# Patient Record
Sex: Female | Born: 1975 | Hispanic: No | Marital: Married | Smoking: Current every day smoker
Health system: Southern US, Community
[De-identification: ages and names within clinical notes are randomized; demographics above are authoritative.]

## PROBLEM LIST (undated history)

## (undated) DIAGNOSIS — M1990 Unspecified osteoarthritis, unspecified site: Secondary | ICD-10-CM

---

## 1999-03-17 ENCOUNTER — Inpatient Hospital Stay (HOSPITAL_COMMUNITY): Admission: AD | Admit: 1999-03-17 | Discharge: 1999-03-17 | Payer: Self-pay | Admitting: Obstetrics

## 1999-03-17 ENCOUNTER — Encounter: Payer: Self-pay | Admitting: Obstetrics

## 2000-10-10 ENCOUNTER — Other Ambulatory Visit: Admission: RE | Admit: 2000-10-10 | Discharge: 2000-10-10 | Payer: Self-pay

## 2003-01-22 ENCOUNTER — Encounter: Payer: Self-pay | Admitting: Obstetrics & Gynecology

## 2003-01-22 ENCOUNTER — Observation Stay (HOSPITAL_COMMUNITY): Admission: AD | Admit: 2003-01-22 | Discharge: 2003-01-24 | Payer: Self-pay | Admitting: Obstetrics and Gynecology

## 2003-01-25 ENCOUNTER — Inpatient Hospital Stay (HOSPITAL_COMMUNITY): Admission: AD | Admit: 2003-01-25 | Discharge: 2003-01-26 | Payer: Self-pay | Admitting: Obstetrics and Gynecology

## 2003-02-03 ENCOUNTER — Ambulatory Visit (HOSPITAL_COMMUNITY): Admission: AD | Admit: 2003-02-03 | Discharge: 2003-02-03 | Payer: Self-pay | Admitting: Obstetrics and Gynecology

## 2003-12-22 ENCOUNTER — Ambulatory Visit (HOSPITAL_COMMUNITY): Admission: RE | Admit: 2003-12-22 | Discharge: 2003-12-22 | Payer: Self-pay | Admitting: Rheumatology

## 2004-06-01 ENCOUNTER — Ambulatory Visit (HOSPITAL_COMMUNITY): Admission: RE | Admit: 2004-06-01 | Discharge: 2004-06-01 | Payer: Self-pay | Admitting: Rheumatology

## 2011-04-07 ENCOUNTER — Other Ambulatory Visit (HOSPITAL_COMMUNITY): Payer: Self-pay | Admitting: Family Medicine

## 2011-04-07 ENCOUNTER — Ambulatory Visit (HOSPITAL_COMMUNITY)
Admission: RE | Admit: 2011-04-07 | Discharge: 2011-04-07 | Disposition: A | Discharge status: Home / Self Care | Payer: 59 | Source: Ambulatory Visit | Attending: Family Medicine | Admitting: Family Medicine

## 2011-04-07 DIAGNOSIS — M25539 Pain in unspecified wrist: Secondary | ICD-10-CM | POA: Insufficient documentation

## 2011-04-07 DIAGNOSIS — M25579 Pain in unspecified ankle and joints of unspecified foot: Secondary | ICD-10-CM | POA: Insufficient documentation

## 2011-04-07 DIAGNOSIS — R52 Pain, unspecified: Secondary | ICD-10-CM

## 2011-06-23 ENCOUNTER — Ambulatory Visit (HOSPITAL_COMMUNITY)
Admission: RE | Admit: 2011-06-23 | Discharge: 2011-06-23 | Disposition: A | Discharge status: Home / Self Care | Payer: 59 | Source: Ambulatory Visit | Attending: Family Medicine | Admitting: Family Medicine

## 2011-06-23 ENCOUNTER — Other Ambulatory Visit (HOSPITAL_COMMUNITY): Payer: Self-pay | Admitting: Family Medicine

## 2011-06-23 DIAGNOSIS — M25476 Effusion, unspecified foot: Secondary | ICD-10-CM | POA: Insufficient documentation

## 2011-06-23 DIAGNOSIS — M25579 Pain in unspecified ankle and joints of unspecified foot: Secondary | ICD-10-CM | POA: Insufficient documentation

## 2011-06-23 DIAGNOSIS — M1990 Unspecified osteoarthritis, unspecified site: Secondary | ICD-10-CM

## 2011-06-23 DIAGNOSIS — M25473 Effusion, unspecified ankle: Secondary | ICD-10-CM | POA: Insufficient documentation

## 2012-01-11 IMAGING — CR DG FOOT COMPLETE 3+V*L*
3 series · 3 of 3 positions shown · non-contrast
Comparison: None

CLINICAL DATA: Pain and swelling.

LEFT FOOT - COMPLETE 3+ VIEW

[view not recorded (1 of 3)]
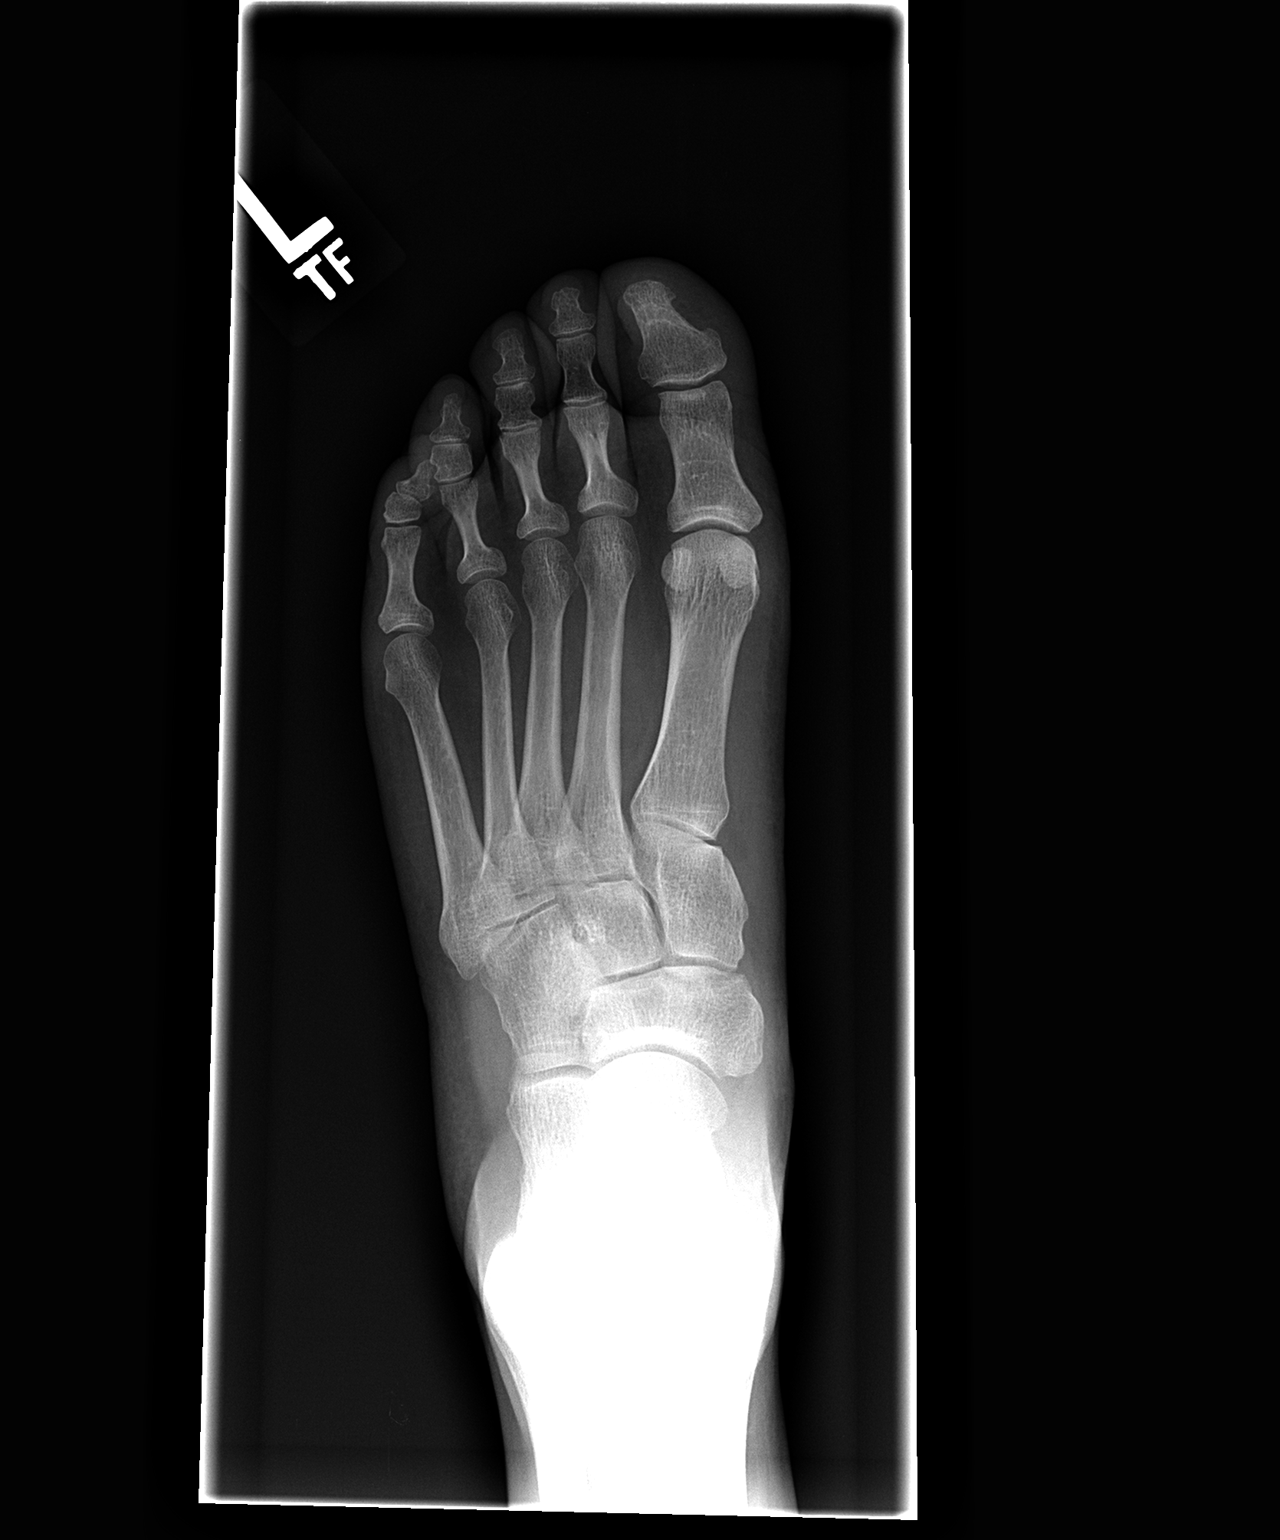

[view not recorded (2 of 3)]
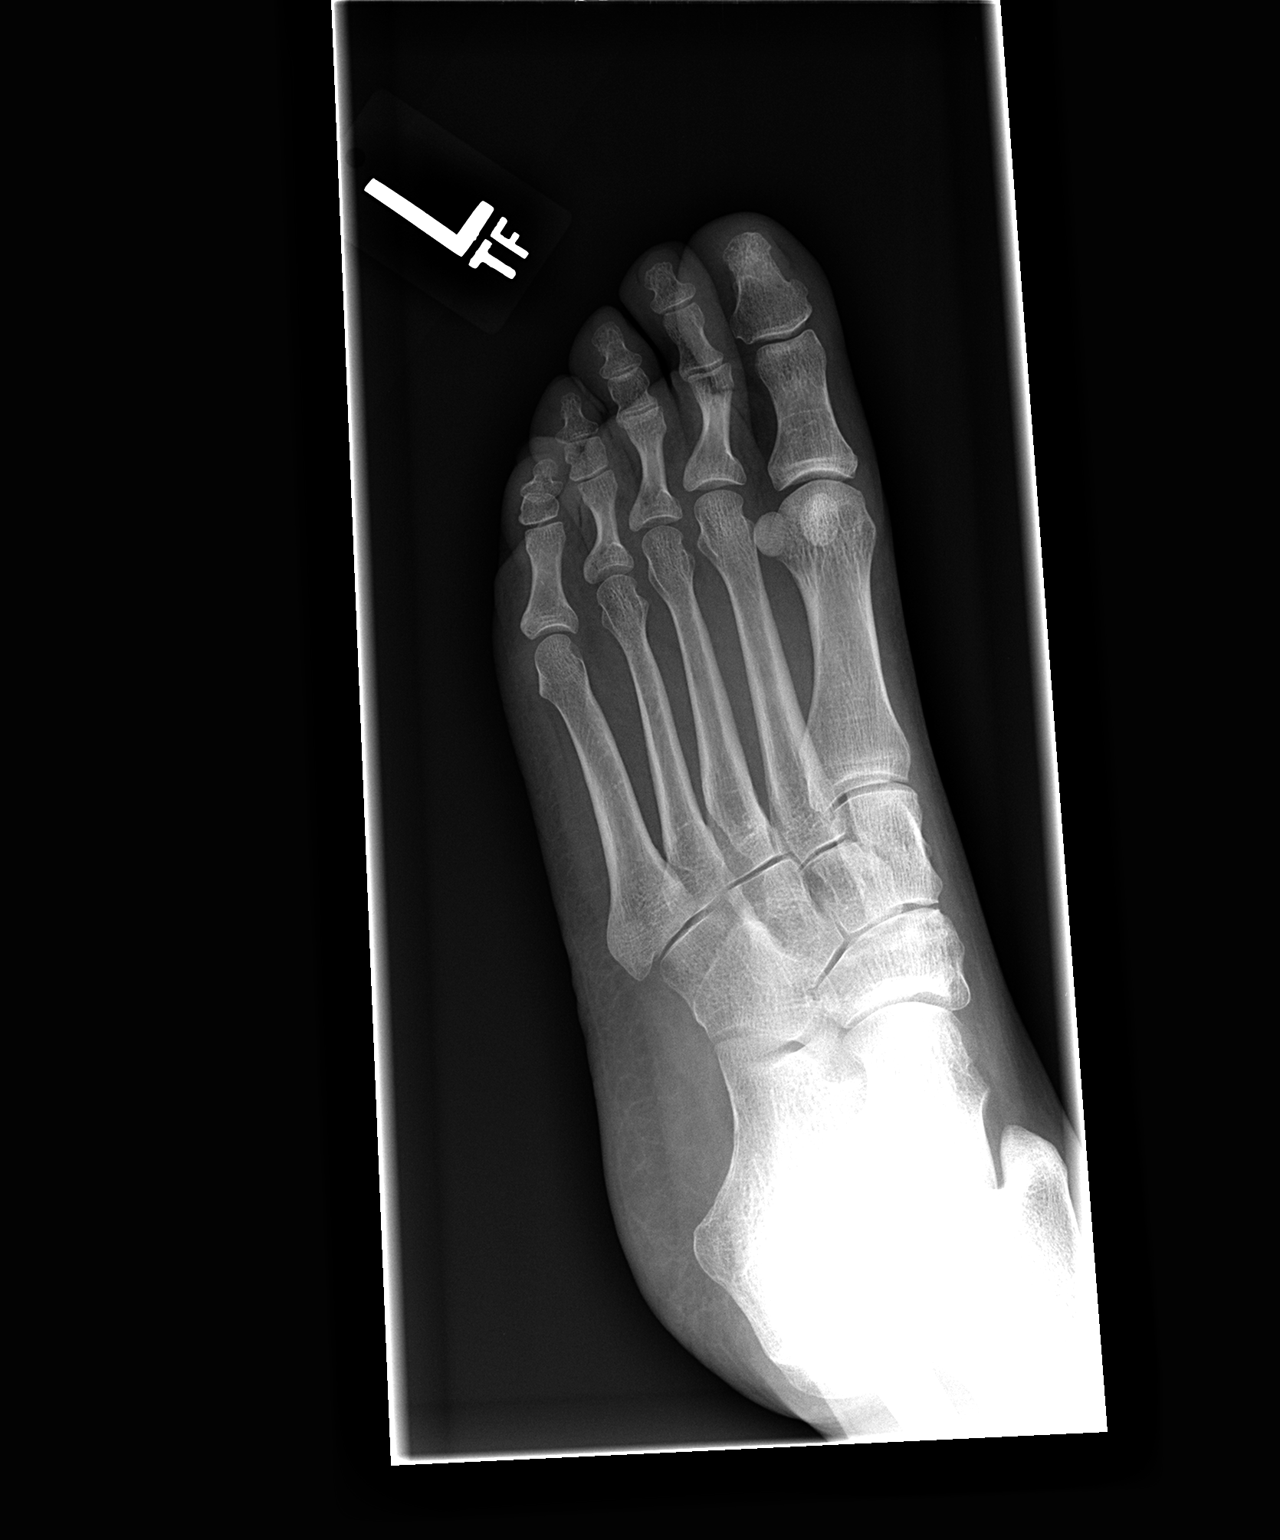

[view not recorded (3 of 3)]
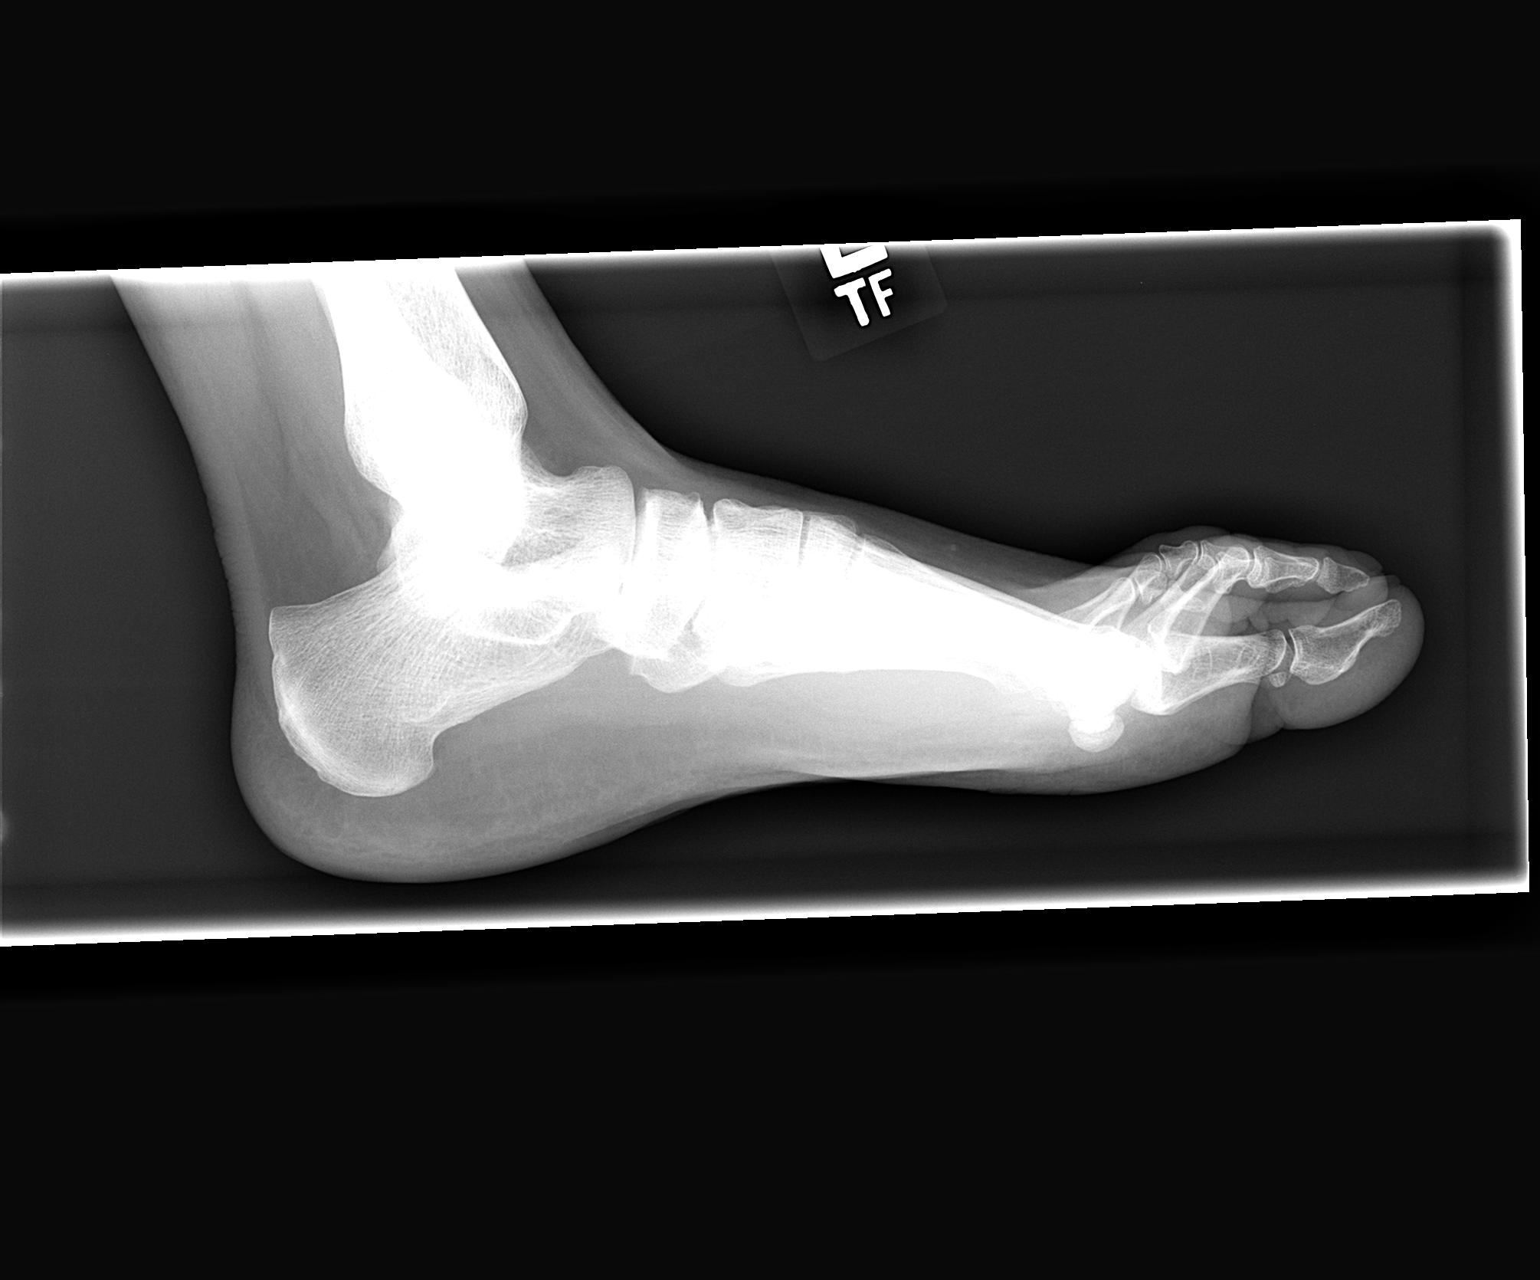

[3 of 3 positions shown; findings below may reference images not displayed]

FINDINGS: The joint spaces are maintained.  No erosions or cystic
changes.  Mineralization appears normal.
IMPRESSION: No degenerative changes or erosive findings.

## 2012-03-28 IMAGING — CR DG FOOT COMPLETE 3+V*R*
3 series · 3 of 3 positions shown · non-contrast
Comparison: 04/07/2011.

CLINICAL DATA: Pain and swelling in the right ankle and foot.

RIGHT FOOT COMPLETE - 3+ VIEW

[view not recorded (1 of 3)]
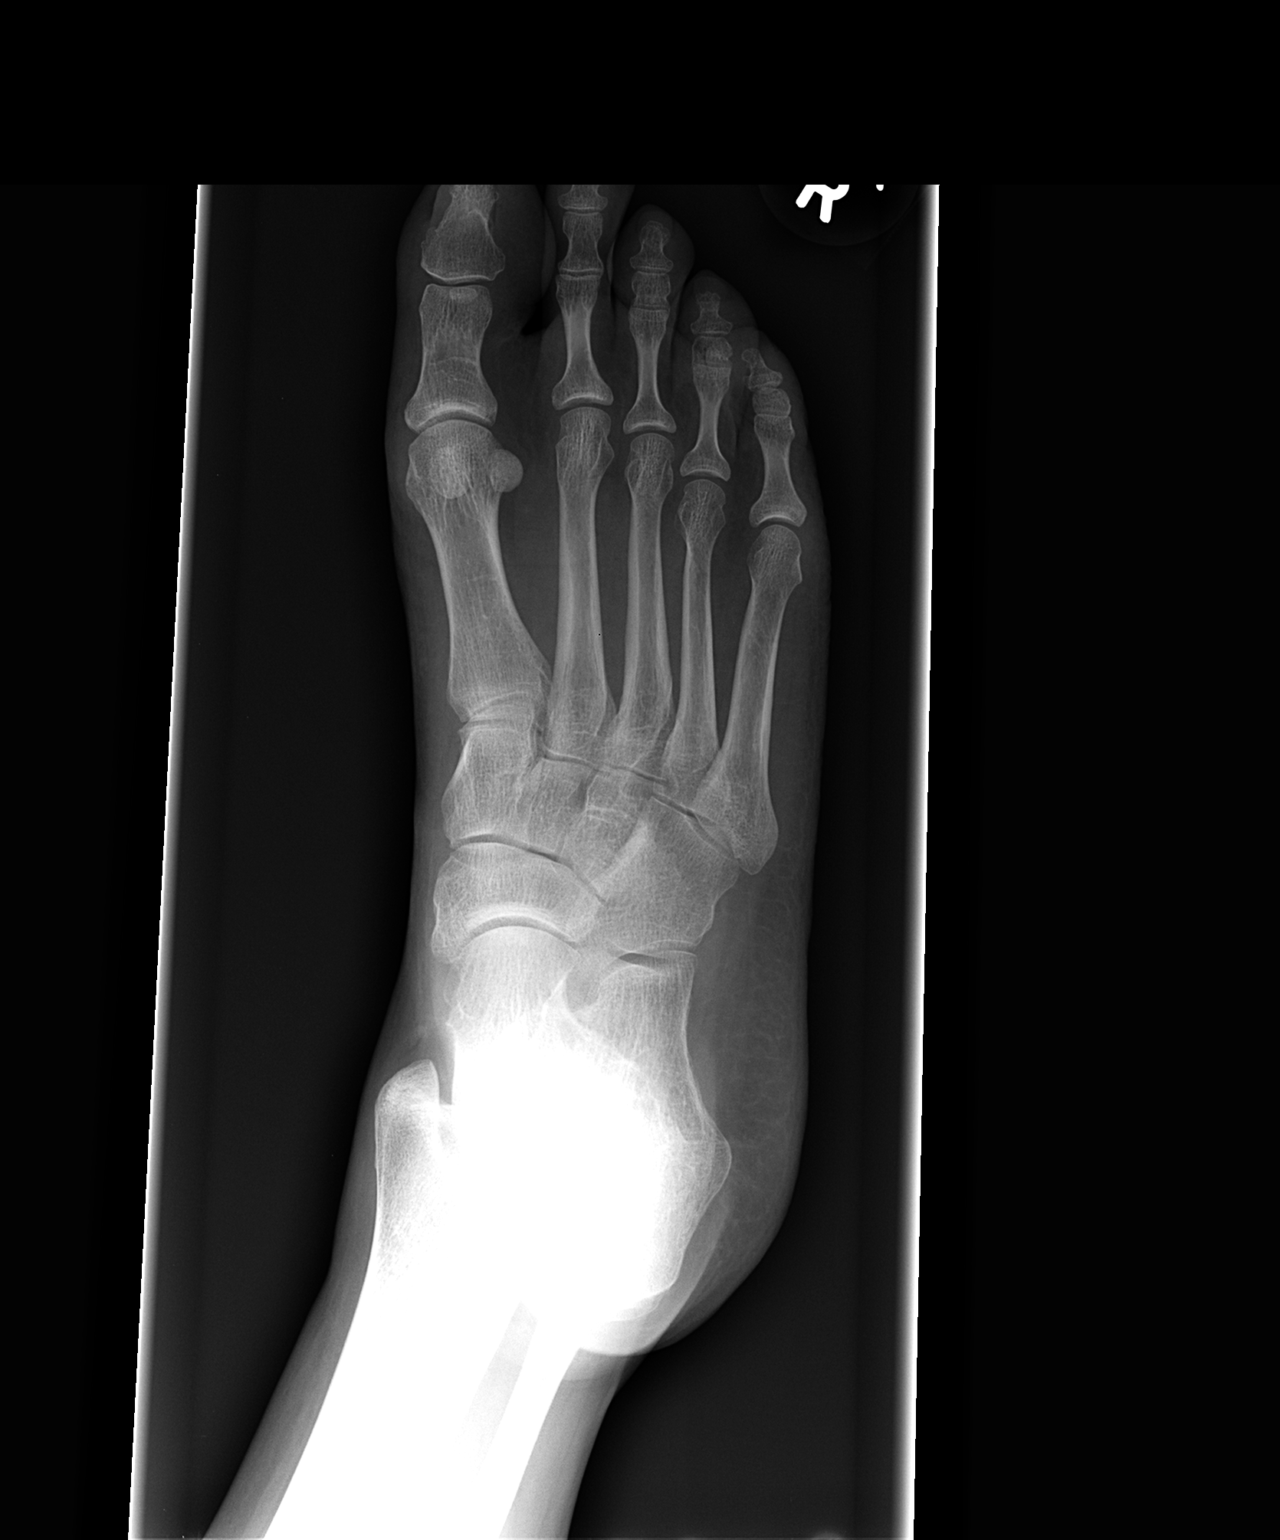

[view not recorded (2 of 3)]
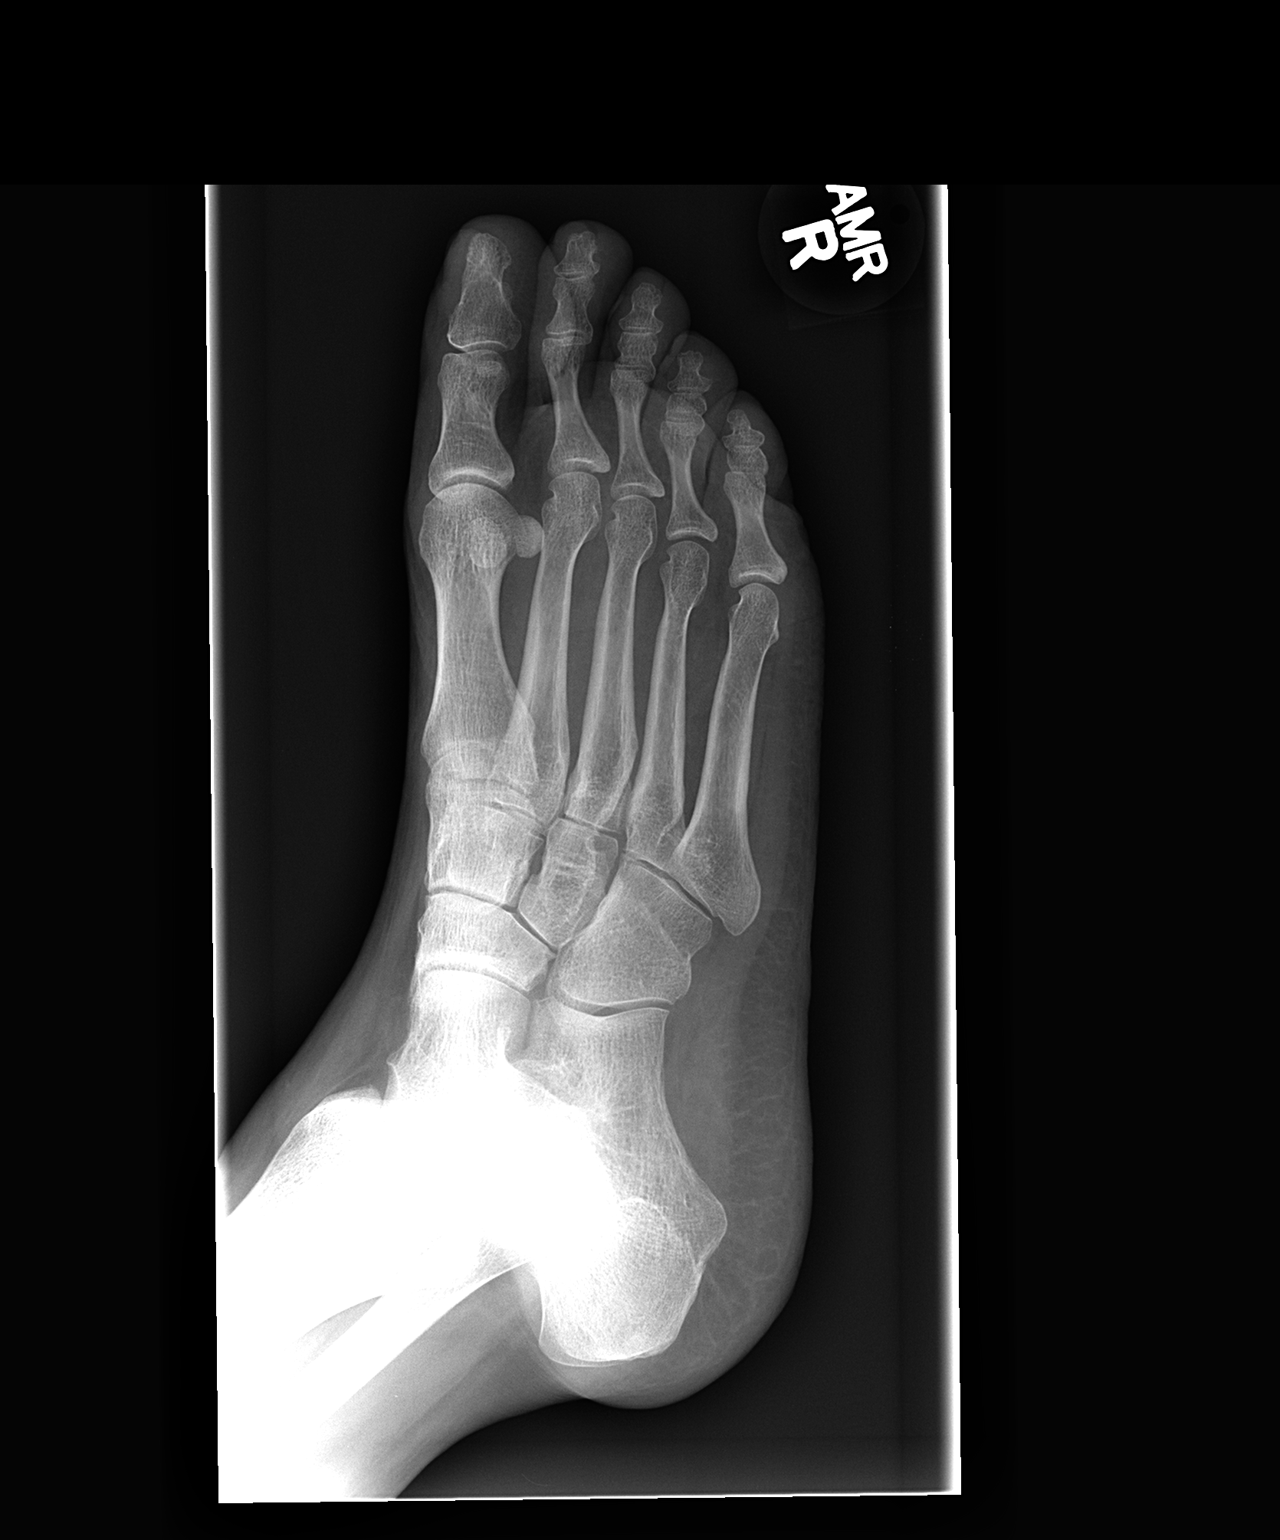

[view not recorded (3 of 3)]
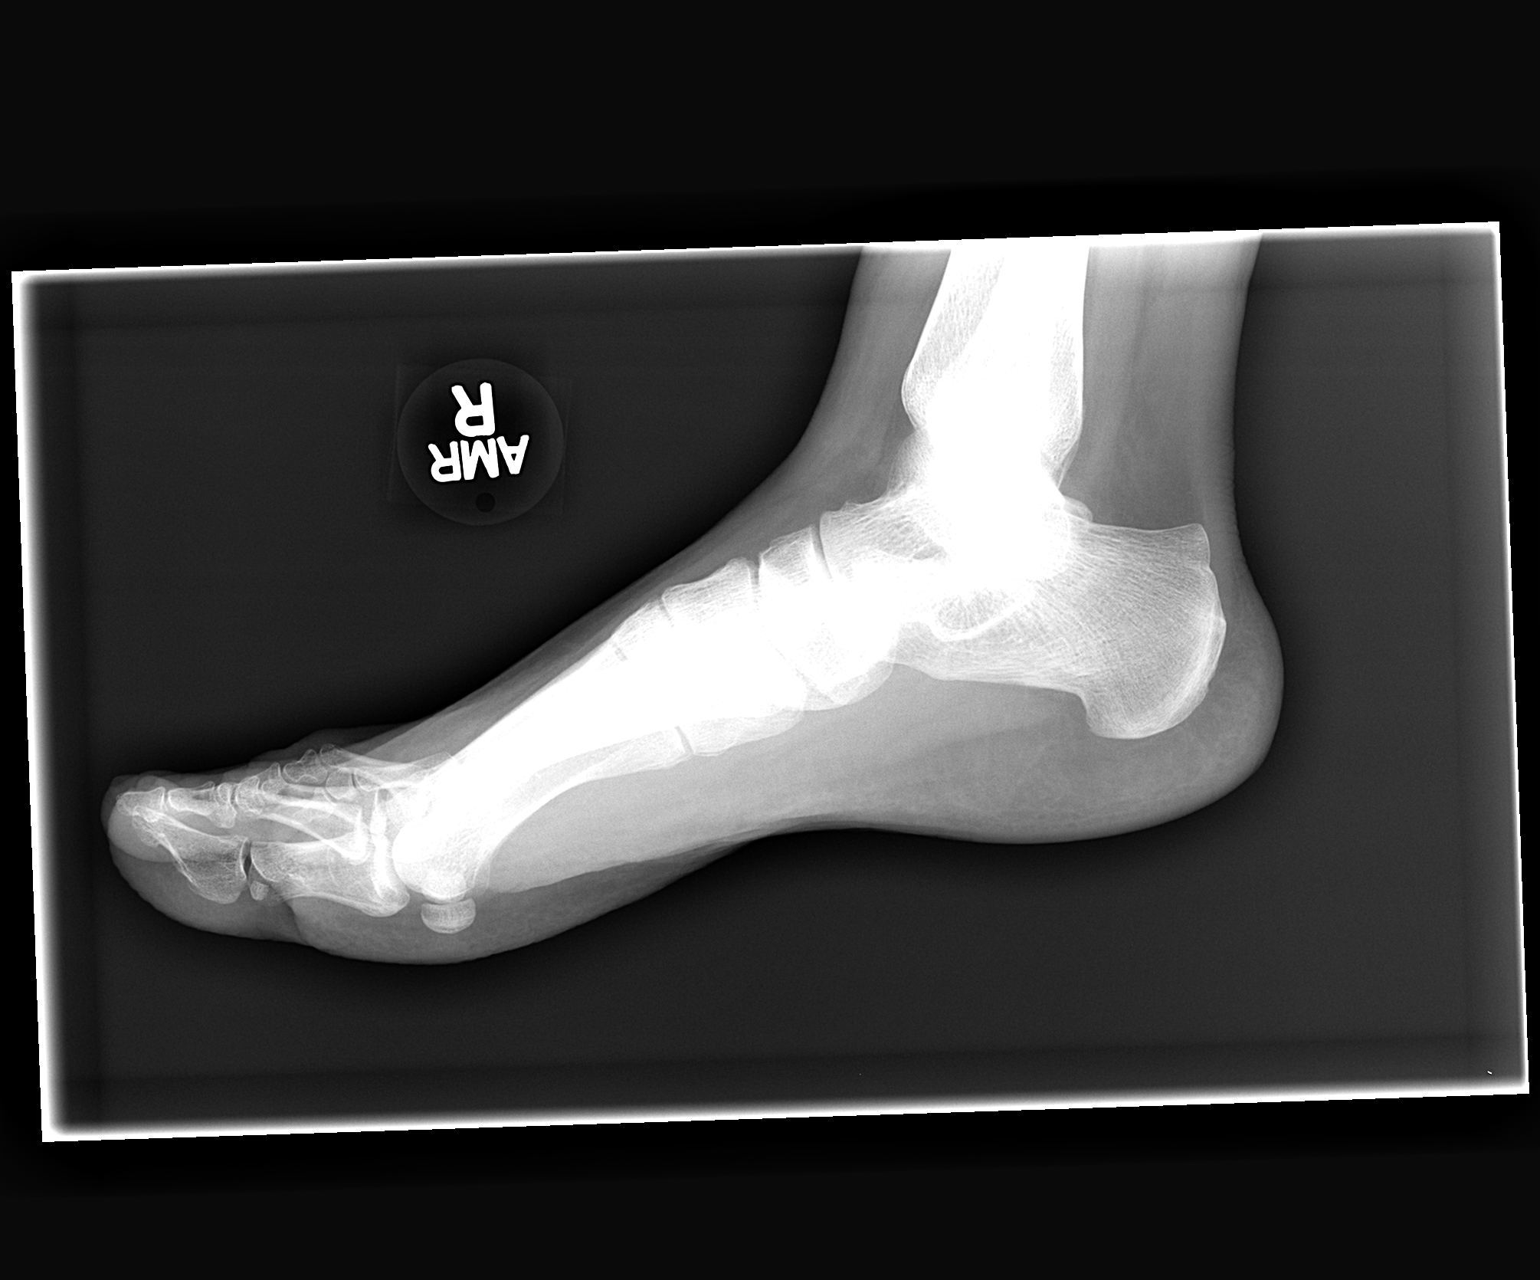

[3 of 3 positions shown; findings below may reference images not displayed]

FINDINGS: No acute osseous or joint abnormality.
IMPRESSION: No acute osseous or joint abnormality.

## 2018-10-09 ENCOUNTER — Ambulatory Visit: Payer: Self-pay

## 2018-10-09 NOTE — Telephone Encounter (Signed)
Incoming call from Patient who states that she  Was exposed to someone who tested positive for covid-19.  Patient reports a headache and aches allover her body .  Has not traveled out of the Country.  States she touched paper work that the person who tested positive.  Its was on May, 18th.    Reason for Disposition . [1] COVID-19 EXPOSURE (Close Contact) within last 14 days AND [2] NO cough, fever, or breathing difficulty  Answer Assessment - Initial Assessment Questions 1. CLOSE CONTACT: "Who is the person with the confirmed or suspected COVID-19 infection that you were exposed to?"     work 2. PLACE of CONTACT: "Where were you when you were exposed to COVID-19?" (e.g., home, school, medical waiting room; which city?)     work 3. TYPE of CONTACT: "How much contact was there?" (e.g., sitting next to, live in same house, work in same office, same building)     Same building 4. DURATION of CONTACT: "How long were you in contact with the COVID-19 patient?" (e.g., a few seconds, passed by person, a few minutes, live with the patient)    Touched paper work  I touched 5. DATE of CONTACT: "When did you have contact with a COVID-19 patient?" (e.g., how many days ago)     May /18/20 6. TRAVEL: "Have you traveled out of the country recently?" If so, "When and where?"  no     * Also ask about out-of-state travel, since the CDC has identified some high risk cities for community spread in the [place].     * Note: Travel becomes less relevant if there is widespread community transmission where the patient lives.      7. COMMUNITY SPREAD: "Are there lots of cases of COVID-19 (community spread) where you live?" (See public health department website, if unsure)   * MAJOR community spread: high number of cases; numbers of cases are increasing; many people hospitalized.   * MINOR community spread: low number of cases; not increasing; few or no people hospitalized      8. SYMPTOMS: "Do you have any symptoms?" (e.g.,  fever, cough, breathing difficulty)     Denies 9. PREGNANCY OR POSTPARTUM: "Is there any chance you are pregnant?" "When was your last menstrual period?" "Did you deliver in the last 2 weeks?"     2 weeks ago  10. HIGH RISK: "Do you have any heart or lung problems? Do you have a weak immune system?" (e.g., CHF, COPD, asthma, HIV positive, chemotherapy, renal failure, diabetes mellitus, sickle cell anemia)       Weak  Immune sysytem  Protocols used: CORONAVIRUS (COVID-19) EXPOSURE-A-AH

## 2020-06-18 ENCOUNTER — Encounter: Payer: Self-pay | Admitting: Emergency Medicine

## 2020-06-18 ENCOUNTER — Other Ambulatory Visit: Payer: Self-pay

## 2020-06-18 ENCOUNTER — Ambulatory Visit
Admission: EM | Admit: 2020-06-18 | Discharge: 2020-06-18 | Disposition: A | Discharge status: Home / Self Care | Payer: 59 | Attending: Emergency Medicine | Admitting: Emergency Medicine

## 2020-06-18 DIAGNOSIS — L249 Irritant contact dermatitis, unspecified cause: Secondary | ICD-10-CM

## 2020-06-18 HISTORY — DX: Unspecified osteoarthritis, unspecified site: M19.90

## 2020-06-18 MED ORDER — HYDROCORTISONE 2.5 % EX OINT: TOPICAL_OINTMENT | Freq: Two times a day (BID) | CUTANEOUS | 0 refills | Status: AC

## 2020-06-18 NOTE — ED Triage Notes (Signed)
Eyes itching and red x 2 days

## 2020-06-18 NOTE — ED Provider Notes (Signed)
HPI  SUBJECTIVE:  Theresa Lang is a 45 y.o. female who presents with 2 days of periorbital erythema, edema, itching after using an exfoliant on her eyelids in order to get make-up off.  States that she had difficulty opening her eyes yesterday.  She reports dry skin.  No pain, fevers, visual changes, watery eyes, eye discharge.  No nasal congestion, sneezing, eye pain.  She has been taking Benadryl, using tea bags, a polymyxin/antibiotic ointment from [place] cool compresses.  Cool compresses help.  No aggravating factors.  She has a past medical history of rheumatoid arthritis and is on Imuran.  No history of eczema, diabetes.  LMP: Last month.  Denies possibility being pregnant.  EUM:PNTIRWER, Gerlene Burdock, MD     Past Medical History:  Diagnosis Date  . Arthritis     History reviewed. No pertinent surgical history.  No family history on file.  Social History   Tobacco Use  . Smoking status: Current Some Day Smoker    Packs/day: 0.50  . Smokeless tobacco: Never Used  Vaping Use  . Vaping Use: Former  Substance Use Topics  . Alcohol use: Never  . Drug use: Never    No current facility-administered medications for this encounter.  Current Outpatient Medications:  .  hydrocortisone 2.5 % ointment, Apply topically 2 (two) times daily for 14 days., Disp: 30 g, Rfl: 0  Not on File   ROS  As noted in HPI.   Physical Exam  BP 133/84 (BP Location: Right Arm)   Pulse 95   Temp 98.2 F (36.8 C) (Oral)   Resp 16   Ht 4\' 11"  (1.499 m)   Wt 56.7 kg   LMP 05/20/2020   SpO2 98%   BMI 25.25 kg/m   Constitutional: Well developed, well nourished, no acute distress Eyes:  EOMI, mild bilateral conjunctival injection.  Nontender bilateral periorbital erythema, dry skin.  Bilateral upper eyelid swelling.  No pain with EOMs, no direct or consensual photophobia.  PERRLA.     HENT: Normocephalic, atraumatic,mucus membranes moist Respiratory: Normal inspiratory  effort Cardiovascular: Normal rate GI: nondistended skin: No rash, skin intact Musculoskeletal: no deformities Neurologic: Alert & oriented x 3, no focal neuro deficits Psychiatric: Speech and behavior appropriate   ED Course   Medications - No data to display  No orders of the defined types were placed in this encounter.   No results found for this or any previous visit (from the past 24 hour(s)). No results found.  ED Clinical Impression  1. Acute irritant contact dermatitis      ED Assessment/Plan  Patient with an irritant contact dermatitis from using exfoliant on her eyelids.  Will send home with Claritin, cool compresses, hydrocortisone ointment.  Discontinue Benadryl. work note for today and tomorrow.   Meds ordered this encounter  Medications  . hydrocortisone 2.5 % ointment    Sig: Apply topically 2 (two) times daily for 14 days.    Dispense:  30 g    Refill:  0    *This clinic note was created using 05/22/2020. Therefore, there may be occasional mistakes despite careful proofreading.   ?    Scientist, clinical (histocompatibility and immunogenetics), MD 06/18/20 1950

## 2020-06-18 NOTE — Discharge Instructions (Addendum)
Try Claritin.  Stop Benadryl.  Cool compresses.  Use the hydrocortisone ointment as needed.  You can stop when you are feeling better.

## 2022-05-26 ENCOUNTER — Encounter: Payer: Self-pay | Admitting: Internal Medicine

## 2022-07-03 NOTE — Progress Notes (Unsigned)
GI Office Note    Referring Provider: [place] Nation, MD Primary Care Physician:  [place] Nation, MD  Primary Gastroenterologist: Cristopher Estimable.Rourk, MD  Chief Complaint   Chief Complaint  Patient presents with   abnormal labs   History of Present Illness   Theresa Lang is a 47 y.o. female presenting today at the request of [place] Nation, MD for elevated LFTs.  Labs December 2022: alk phos 166, other LFTs normal. Albumin 3.4  Labs 03/30/22: alk phos 133, ALT 42; other LFTs normal. Cholesterol 230, LDL 144  CT chest 04/21/22: -solid pulmonary nodule in right middle lobe without significant change -likely benign etiology (no follow up recommended)  Abdominal US 04/21/22: -increased parenchymal echogenicity . Normal doppler -normal spleen -normal gallbladder  Today: Alcohol: occasional (socia).  Drugs?: none Tobacco use: 6 cigarettes daily. (Started at age 25) Herbals: none  Has been on adderal for about 6 years or so. Sometimes takes pain medication for her arthritis. Was on suboxone for a while and not does not take anything. Reports her BP is up No trouble with constipation since stopping pain medications.    Has been tired, weak, no energy. Works and comes home and eats and lays done. Has been more fatigued since last year about September. No recent changes that she knows of at that time. No melena or brbpr. Denies constipation or diarrhea. No abdominal pain, weight loss, lack of appetite. Denies chest pain, shortness of breath, edema.   Does have acid reflux. - takes pepcid as needed. No dysphagia. Right now with dentition issues. Hard to chew foods.   Walks a lot at her job. Reports she has been more stressed at her job.   Couple years ago got a pill stuck in her throat and had some bleeding. Denied having endoscopy. Has been worried about the nodule on her lung. Sometimes has trouble sleeping as well.   Current Outpatient Medications  Medication Sig  Dispense Refill   amphetamine-dextroamphetamine (ADDERALL) 20 MG tablet Take 10 mg by mouth 3 (three) times daily.     HUMIRA, 2 PEN, 40 MG/0.4ML PNKT SMARTSIG:40 Milligram(s) SUB-Q Every 2 Weeks     Vitamin D, Ergocalciferol, (DRISDOL) 1.25 MG (50000 UNIT) CAPS capsule Take 50,000 Units by mouth once a week.     No current facility-administered medications for this visit.    Past Medical History:  Diagnosis Date   Arthritis     History reviewed. No pertinent surgical history.  History reviewed. No pertinent family history.  Allergies as of 07/04/2022   (No Known Allergies)    Social History   Socioeconomic History   Marital status: Married    Spouse name: Not on file   Number of children: Not on file   Years of education: Not on file   Highest education level: Not on file  Occupational History   Not on file  Tobacco Use   Smoking status: Every Day    Packs/day: 0.50    Types: Cigarettes   Smokeless tobacco: Never  Vaping Use   Vaping Use: Former  Substance and Sexual Activity   Alcohol use: Yes    Comment: occ   Drug use: Not Currently   Sexual activity: Yes  Other Topics Concern   Not on file  Social History Narrative   Not on file   Social Determinants of Health   Financial Resource Strain: Not on file  Food Insecurity: Not on file  Transportation Needs: Not on file  Physical Activity: Not on file  Stress: Not on file  Social Connections: Not on file  Intimate Partner Violence: Not on file     Review of Systems   Gen: + fatigue. Denies any fever, chills, fatigue, weight loss, lack of appetite.  CV: Denies chest pain, heart palpitations, peripheral edema, syncope.  Resp: Denies shortness of breath at rest or with exertion. Denies wheezing or cough.  GI: see HPI GU : Denies urinary burning, urinary frequency, urinary hesitancy MS: Denies joint pain, muscle weakness, cramps, or limitation of movement.  Derm: Denies rash, itching, dry skin Psych:  Denies depression, anxiety, memory loss, and confusion Heme: Denies bruising, bleeding, and enlarged lymph nodes.   Physical Exam   BP (!) 140/88 (BP Location: Right Arm, Patient Position: Sitting, Cuff Size: Normal)   Pulse 86   Temp 98.2 F (36.8 C) (Oral)   Ht 4' 11"$  (1.499 m)   Wt 148 lb (67.1 kg)   LMP  (Approximate)   SpO2 97%   BMI 29.89 kg/m   General:   Alert and oriented. Pleasant and cooperative. Well-nourished and well-developed.  Head:  Normocephalic and atraumatic. Eyes:  Without icterus, sclera clear and conjunctiva pink.  Ears:  Normal auditory acuity. Mouth:  Mask in place.  Lungs:  Clear to auscultation bilaterally. No wheezes, rales, or rhonchi. No distress.  Heart:  S1, S2 present without murmurs appreciated.  Abdomen:  +BS, soft, non-tender and non-distended. No HSM noted. No guarding or rebound. No masses appreciated.  Rectal:  Deferred  Msk:  Symmetrical without gross deformities. Normal posture. Extremities:  Without edema. Neurologic:  Alert and  oriented x4;  grossly normal neurologically. Skin:  Intact without significant lesions or rashes. Psych:  Alert and cooperative. Normal mood and affect.   Assessment   Theresa Lang is a 47 y.o. female with a history of arthritis, ADHD, vitamin D deficiency, and tobacco use presenting today for evaluation of elevated LFTs.   Elevated LFTs, hepatic steatosis: Following with occasional/social alcohol use.  History of chronic pain medication with transition to Suboxone and now currently not taking any medication.  This was previously used for her arthritis.  Had slightly elevated alk phos to 133 and ALT to 42 in November 2023.  Also with elevated cholesterol and LDL at that time.  Ultrasound in November 2023 with hepatic steatosis without splenomegaly, cholelithiasis.  Does have chronic tobacco use and denies any recent herbals.  Of note she has been on Adderall for over 6 years.  Will recheck HFP and GGT as well as  hep B and hepatitis A.  Of note she has had previous negative hep C antibody in 2022.  We discussed fatty liver as well as necessary diet changes and goals of weight loss.  Separate written education provided, see AVS.  Fatigue: Increased fatigue since September last year. Does admit to increased stress and less sleep. Denies any chest pain, shortness of breath, falls, dizziness. No weight loss. Likely secondary to lack of sleep and stress however will rule out possible anemia as contributing factor. Denies any melena or brbpr. Will check CBC and iron panel.   PLAN    Fasting HFP and GGT, CBC, iron panel HBsAg, anti-HBs, anti-HBc, Hep A Ab Fatty liver diet, separate handout provided Follow up in 3 months and discuss screening colonoscopy.    Venetia Night, MSN, FNP-BC, AGACNP-BC [facility]

## 2022-07-03 NOTE — Patient Instructions (Incomplete)
Instructions for fatty liver: Recommend 1-2# weight loss per week until ideal body weight through exercise & diet. Low fat/cholesterol diet.   Avoid sweets, sodas, fruit juices, sweetened beverages like tea, etc. Gradually increase exercise from 15 min daily up to 1 hr per day 5 days/week. Limit alcohol use.  I am ordering labs for you to have completed at [facility]. This is at [address] here in .   I have attached education for you.  We will follow up in 3 months.   It was a pleasure to see you today. I want to create trusting relationships with patients. If you receive a survey regarding your visit,  I greatly appreciate you taking time to fill this out on paper or through your MyChart. I value your feedback.  Brooke Bonito, MSN, FNP-BC, AGACNP-BC [facility]

## 2022-07-04 ENCOUNTER — Ambulatory Visit (INDEPENDENT_AMBULATORY_CARE_PROVIDER_SITE_OTHER): Payer: 59 | Admitting: Gastroenterology

## 2022-07-04 ENCOUNTER — Encounter: Payer: Self-pay | Admitting: Gastroenterology

## 2022-07-04 VITALS — BP 122/88 | HR 75 | Temp 98.2°F | Ht 59.0 in | Wt 148.0 lb

## 2022-07-04 DIAGNOSIS — R5383 Other fatigue: Secondary | ICD-10-CM | POA: Diagnosis not present

## 2022-07-04 DIAGNOSIS — R7989 Other specified abnormal findings of blood chemistry: Secondary | ICD-10-CM

## 2022-07-11 ENCOUNTER — Encounter: Payer: Self-pay | Admitting: Gastroenterology

## 2022-10-03 ENCOUNTER — Ambulatory Visit: Payer: 59 | Admitting: Gastroenterology

## 2022-10-03 ENCOUNTER — Encounter: Payer: Self-pay | Admitting: Gastroenterology

## 2022-10-03 NOTE — Progress Notes (Deleted)
GI Office Note    Referring Provider: Donetta Potts, MD Primary Care Physician:  Donetta Potts, MD Primary Gastroenterologist: Gerrit Friends.Rourk, MD  Date:  10/03/2022  ID:  Earl Gala, DOB December 15, 1975, MRN [id number]   Chief Complaint   No chief complaint on file.  History of Present Illness  Theresa Lang is a 47 y.o. female with a history of ADHD, vitamin D deficiency, tobacco use, arthritis, ADHD, and hepatic steatosis presenting today for follow-up of elevated LFTs.  Labs December 2022: alk phos 166, other LFTs normal. Albumin 3.4   Labs 03/30/22: alk phos 133, ALT 42; other LFTs normal. Cholesterol 230, LDL 144   CT chest 04/21/22: -solid pulmonary nodule in right middle lobe without significant change -likely benign etiology (no follow up recommended)   Abdominal US 04/21/22: -increased parenchymal echogenicity . Normal doppler -normal spleen -normal gallbladder  Initial office visit 07/04/2022.  She reported occasional social alcohol use, no drug use, smokes about 6 cigarettes daily.  Denied any herbal use.  Says she been on Adderall for about 6 years or more and sometimes takes pain medication for her arthritis, previously on Suboxone.  Did report chronic fatigue for about 5 months.  Has had increased stress.  Did previously report an episode of pill esophagitis and some bleeding from her throat.  Advised to check fasting HFP and GGT as well as CBC and iron panel and acute hepatitis panel.  Provided handout on fatty liver diet.  Advised we would discuss screening colonoscopy at follow-up.  Does not appear that the labs previously ordered at her last office visit were completed.  Labs 08/31/2022: Alk phos 142, AST 44, ALT 88, T. bili 0.4, creatinine 0.74, hemoglobin 15.   Today:   Current Outpatient Medications  Medication Sig Dispense Refill   amphetamine-dextroamphetamine (ADDERALL) 20 MG tablet Take 10 mg by mouth 3 (three) times daily.     HUMIRA, 2 PEN,  40 MG/0.4ML PNKT SMARTSIG:40 Milligram(s) SUB-Q Every 2 Weeks     Vitamin D, Ergocalciferol, (DRISDOL) 1.25 MG (50000 UNIT) CAPS capsule Take 50,000 Units by mouth once a week.     No current facility-administered medications for this visit.    Past Medical History:  Diagnosis Date   Arthritis     No past surgical history on file.  Family History  Problem Relation Age of Onset   Diabetes Maternal Aunt    Liver disease Neg Hx    Colon cancer Neg Hx    Colon polyps Neg Hx     Allergies as of 10/03/2022   (No Known Allergies)    Social History   Socioeconomic History   Marital status: Married    Spouse name: Not on file   Number of children: Not on file   Years of education: Not on file   Highest education level: Not on file  Occupational History   Not on file  Tobacco Use   Smoking status: Every Day    Packs/day: .5    Types: Cigarettes   Smokeless tobacco: Never  Vaping Use   Vaping Use: Former  Substance and Sexual Activity   Alcohol use: Yes    Comment: occ   Drug use: Not Currently   Sexual activity: Yes  Other Topics Concern   Not on file  Social History Narrative   Not on file   Social Determinants of Health   Financial Resource Strain: Not on file  Food Insecurity: Not on file  Transportation Needs: Not on file  Physical Activity: Not on file  Stress: Not on file  Social Connections: Not on file     Review of Systems   Gen: Denies fever, chills, anorexia. Denies fatigue, weakness, weight loss.  CV: Denies chest pain, palpitations, syncope, peripheral edema, and claudication. Resp: Denies dyspnea at rest, cough, wheezing, coughing up blood, and pleurisy. GI: See HPI Derm: Denies rash, itching, dry skin Psych: Denies depression, anxiety, memory loss, confusion. No homicidal or suicidal ideation.  Heme: Denies bruising, bleeding, and enlarged lymph nodes.   Physical Exam   There were no vitals taken for this visit.  General:   Alert and  oriented. No distress noted. Pleasant and cooperative.  Head:  Normocephalic and atraumatic. Eyes:  Conjuctiva clear without scleral icterus. Mouth:  Oral mucosa pink and moist. Good dentition. No lesions. Lungs:  Clear to auscultation bilaterally. No wheezes, rales, or rhonchi. No distress.  Heart:  S1, S2 present without murmurs appreciated.  Abdomen:  +BS, soft, non-tender and non-distended. No rebound or guarding. No HSM or masses noted. Rectal: *** Msk:  Symmetrical without gross deformities. Normal posture. Extremities:  Without edema. Neurologic:  Alert and  oriented x4 Psych:  Alert and cooperative. Normal mood and affect.   Assessment  Theresa Lang is a 47 y.o. female with a history of *** presenting today with   Elevated LFTs, medic steatosis:  Screening for colon cancer:  PLAN   *** Colonoscopy?    Brooke Bonito, MSN, FNP-BC, AGACNP-BC [facility]

## 2022-10-24 ENCOUNTER — Telehealth: Payer: Self-pay

## 2022-10-24 DIAGNOSIS — R911 Solitary pulmonary nodule: Secondary | ICD-10-CM

## 2022-10-24 NOTE — Telephone Encounter (Signed)
Request sent to [facility] for CT via power share.

## 2022-10-25 ENCOUNTER — Inpatient Hospital Stay
Admission: RE | Admit: 2022-10-25 | Discharge: 2022-10-25 | Disposition: A | Discharge status: Home / Self Care | Payer: Self-pay | Source: Ambulatory Visit | Attending: Student in an Organized Health Care Education/Training Program | Admitting: Student in an Organized Health Care Education/Training Program

## 2022-10-25 DIAGNOSIS — R911 Solitary pulmonary nodule: Secondary | ICD-10-CM

## 2022-10-25 NOTE — Telephone Encounter (Signed)
The SS number we have on file does not match the [facility] as on file for [employer].

## 2022-10-25 NOTE — Telephone Encounter (Addendum)
Called [facility] to have images uploaded to pacs. Was advised that they did not have a Earl Gala in their system. They have a [employer] with the same DOB, however last four of social did not match the SS number we have on file. Left message for patient to confirm.  Routing to Dr. Virgil Benedict as an Lorain Childes.      [facility] imaging department contact number [phone]

## 2022-10-26 ENCOUNTER — Institutional Professional Consult (permissible substitution): Payer: 59 | Admitting: Student in an Organized Health Care Education/Training Program

## 2022-10-26 NOTE — Telephone Encounter (Signed)
Lm x2 for patient.   

## 2022-10-27 NOTE — Telephone Encounter (Signed)
Lm x3 for patient.  Will close encounter per office protocol.. Pt canceled 10/26/2022 appt, Nothing further needed.

## 2023-01-02 ENCOUNTER — Institutional Professional Consult (permissible substitution): Payer: 59 | Admitting: Pulmonary Disease

## 2023-02-02 NOTE — Progress Notes (Deleted)
Theresa Lang, female    DOB: [birth year]    MRN: [id number]   Brief patient profile:  47  yo*** *** referred to pulmonary clinic in [place]  02/03/2023 by *** for ***      History of Present Illness  02/03/2023  Pulmonary/ 1st office eval/ Sherene Sires / [place] [facility]  No chief complaint on file.    Dyspnea:  *** Cough: *** Sleep: *** SABA use: *** 02: *** Lung cancer screen: ***   Outpatient Medications Prior to Visit  Medication Sig Dispense Refill   amphetamine-dextroamphetamine (ADDERALL) 20 MG tablet Take 10 mg by mouth 3 (three) times daily.     HUMIRA, 2 PEN, 40 MG/0.4ML PNKT SMARTSIG:40 Milligram(s) SUB-Q Every 2 Weeks     Vitamin D, Ergocalciferol, (DRISDOL) 1.25 MG (50000 UNIT) CAPS capsule Take 50,000 Units by mouth once a week.     No facility-administered medications prior to visit.    Past Medical History:  Diagnosis Date   Arthritis       Objective:     There were no vitals taken for this visit.         Assessment   No problem-specific Assessment & Plan notes found for this encounter.     Sandrea Hughs, MD 02/02/2023

## 2023-02-03 ENCOUNTER — Institutional Professional Consult (permissible substitution): Payer: 59 | Admitting: Internal Medicine

## 2023-02-03 ENCOUNTER — Encounter: Payer: Self-pay | Admitting: Internal Medicine
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
## 2023-03-07 ENCOUNTER — Encounter: Payer: Self-pay | Admitting: Internal Medicine

## 2023-03-07 ENCOUNTER — Ambulatory Visit: Payer: 59 | Admitting: Internal Medicine

## 2023-03-07 VITALS — BP 137/86 | HR 92 | Ht 59.0 in | Wt 145.6 lb

## 2023-03-07 DIAGNOSIS — R911 Solitary pulmonary nodule: Secondary | ICD-10-CM | POA: Diagnosis not present

## 2023-03-07 DIAGNOSIS — F17210 Nicotine dependence, cigarettes, uncomplicated: Secondary | ICD-10-CM | POA: Diagnosis not present

## 2023-03-07 NOTE — Patient Instructions (Addendum)
My office will be contacting you by phone for referral to [facility]  for PFTs  - if you don't hear back from my office within one week please call us back or notify us thru MyChart and we'll address it right away.    Follow up here is as needed for cough or shortness of breath.
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
## 2023-03-07 NOTE — Assessment & Plan Note (Signed)
Counseled re importance of smoking cessation but did not meet time criteria for separate billing    - baseline pfts needed since she also has RA though presently no clinical or radiographic evidence of RA or smoking related lung dz   Discussed in detail all the  indications, usual  risks and alternatives  relative to the benefits with patient who agrees to proceed with w/u as outlined.     F/u in this clinic is prn          Each maintenance medication was reviewed in detail including emphasizing most importantly the difference between maintenance and prns and under what circumstances the prns are to be triggered using an action plan format where appropriate.  Total time for H and P, chart review, counseling,   and generating customized AVS unique to this office visit / same day charting = 30 min new pt eval

## 2023-06-21 ENCOUNTER — Encounter: Payer: Self-pay | Admitting: Internal Medicine

## 2023-07-25 ENCOUNTER — Encounter: Payer: Self-pay | Admitting: Gastroenterology

## 2023-07-25 ENCOUNTER — Telehealth: Payer: Self-pay

## 2023-07-25 ENCOUNTER — Telehealth (INDEPENDENT_AMBULATORY_CARE_PROVIDER_SITE_OTHER): Payer: 59 | Admitting: Gastroenterology

## 2023-07-25 ENCOUNTER — Telehealth: Payer: Self-pay | Admitting: Gastroenterology

## 2023-07-25 VITALS — Ht 59.0 in | Wt 150.0 lb

## 2023-07-25 DIAGNOSIS — Z1211 Encounter for screening for malignant neoplasm of colon: Secondary | ICD-10-CM

## 2023-07-25 DIAGNOSIS — R7989 Other specified abnormal findings of blood chemistry: Secondary | ICD-10-CM | POA: Diagnosis not present

## 2023-07-25 DIAGNOSIS — K219 Gastro-esophageal reflux disease without esophagitis: Secondary | ICD-10-CM

## 2023-07-25 MED ORDER — OMEPRAZOLE 20 MG PO CPDR: 20.0000 mg | DELAYED_RELEASE_CAPSULE | Freq: Every day | ORAL | 1 refills | Status: AC

## 2023-07-25 NOTE — Telephone Encounter (Signed)
 Please schedule RUQ [place] with elastography, Dx: elevated LFTs  Please also schedule colonoscopy with dr Jena Gauss ASA 2. (screening colon cancer) UPT pre-op.   Brooke Bonito, MSN, APRN, FNP-BC, AGACNP-BC [facility] at [facility]

## 2023-07-25 NOTE — Addendum Note (Signed)
 Addended by: Aida Raider on: 07/25/2023 04:25 PM   Modules accepted: Orders

## 2023-07-25 NOTE — Telephone Encounter (Signed)
 [place] scheduled for 07/27/23, arrive at 10:15 am to check in at [facility], NPO after midnight.

## 2023-07-25 NOTE — Progress Notes (Signed)
 Primary Care Physician:  Donetta Potts, MD  Primary Gastroenterologist: Gerrit Friends. Rourk, MD  Patient Location: Home Reason for Visit: follow up Provider Location: Home office  Persons present on the virtual encounter, with roles: Patient - Theresa Lang; Provider - Brooke Bonito, NP   Total time (minutes) spent on medical discussion: 12 minutes  Virtual Visit Encounter Note Visit is conducted virtually and was requested by patient.   I connected with Theresa Lang on 07/25/23 at  3:00 PM EST by video and verified that I am speaking with the correct person using two identifiers.   I discussed the limitations, risks, security and privacy concerns of performing an evaluation and management service by video and the availability of in person appointments. I also discussed with the patient that there may be a patient responsible charge related to this service. The patient expressed understanding and agreed to proceed.  Chief Complaint  Patient presents with   Follow-up    Follow up on LFT's. No other issues    History of Present Illness: Theresa Lang is a 48 y.o. female with a history of arthritis, ADHD, vitamin D deficiency, tobacco use, and elevated LFTs presenting today for follow-up with elevated LFTs.  Labs December 2022: alk phos 166, other LFTs normal. Albumin 3.4   Labs 03/30/22: alk phos 133, ALT 42; other LFTs normal. Cholesterol 230, LDL 144   CT chest 04/21/22: -solid pulmonary nodule in right middle lobe without significant change -likely benign etiology (no follow up recommended)   Abdominal US 04/21/22: -increased parenchymal echogenicity . Normal doppler -normal spleen -normal gallbladder  Initial office visit 07/04/22. History of negative hep C Ab in 2022. Pain medication at times for arthritis. Only social alcohol use. No drug use. No herbs or supplements. Admitted to acid reflux for which pepcid is helpful. Active at her job.  Labs - fasting GGT, HFP,  iron panel, CBC, Hep A and B labs ordered. Fatty liver diet discussed.   Labs never obtained.   Per review of labs from October 2024: Alk phos 154, AST 32, ALT 64, Tbili 0.2, albumin 4.5  Today:  Drinks lots of sodas. Blood sugars are good. Has gained some weight.  She was last 1.5 years that she has gained about 25 pounds.  Denies any lack of appetite or early satiety.  Alcohol: socially No new medications from last visit other than Denies any drug use, herbs, or other supplements other than listed on her med list.  GERD symptoms are everyday - does take pepcid almost daily. Sometime it can be pretty bothersome. She has been having some dental issues but used to be choking a lot related to her teeth.  Denied nausea or vomiting.  Denies Lipidem aphasia or epigastric pain.  No melena or brbpr. Denies constipation or diarrhea.  No family history of colon cancer or polyps.   Last cycle was a little over a year ago.  She does have some fatigue that she stopped having her cycles.  Humira is really helping her arthritis.   Medications Current Meds  Medication Sig   amphetamine-dextroamphetamine (ADDERALL) 20 MG tablet Take 10 mg by mouth 3 (three) times daily.   HUMIRA, 2 PEN, 40 MG/0.4ML PNKT SMARTSIG:40 Milligram(s) SUB-Q Every 2 Weeks   Vitamin D, Ergocalciferol, (DRISDOL) 1.25 MG (50000 UNIT) CAPS capsule Take 50,000 Units by mouth once a week.     History Past Medical History:  Diagnosis Date   Arthritis     No past surgical  history on file.  Family History  Problem Relation Age of Onset   Diabetes Maternal Aunt    Liver disease Neg Hx    Colon cancer Neg Hx    Colon polyps Neg Hx     Social History   Socioeconomic History   Marital status: Married    Spouse name: Not on file   Number of children: Not on file   Years of education: Not on file   Highest education level: Not on file  Occupational History   Not on file  Tobacco Use   Smoking status: Every Day     Current packs/day: 0.50    Types: Cigarettes   Smokeless tobacco: Never  Vaping Use   Vaping status: Former  Substance and Sexual Activity   Alcohol use: Yes    Comment: occ   Drug use: Not Currently   Sexual activity: Yes  Other Topics Concern   Not on file  Social History Narrative   Not on file   Social Drivers of Health   Financial Resource Strain: Not on file  Food Insecurity: Not on file  Transportation Needs: Not on file  Physical Activity: Not on file  Stress: Not on file  Social Connections: Not on file      Review of Systems: Gen: Denies fever, chills, anorexia. Denies fatigue, weakness, weight loss.  CV: Denies chest pain, palpitations, syncope, peripheral edema, and claudication. Resp: Denies dyspnea at rest, cough, wheezing, coughing up blood, and pleurisy. GI: see HPI Derm: Denies rash, itching, dry skin Psych: Denies depression, anxiety, memory loss, confusion. No homicidal or suicidal ideation.  Heme: Denies bruising, bleeding, and enlarged lymph nodes.  Observations/Objective: No distress. Alert and oriented. Pleasant. Well nourished. Normal mood and affect. Unable to perform complete physical exam due to video encounter.   Assessment:  Elevated ALT and alkaline phosphatase: Suspect fatty liver related to metabolic disease. Workup in 2023 with normal ultrasound, alkaline phosphatase and ALT were elevated at that time.  Continues to have some mild elevation in alk phos and ALT as well as currently as noted in HPI.  Previously recommended labs were not performed, patient states that [place] and has never had her labs done after she returns.  Continues to deny any overt drug use, herbs, or supplements.  Will check FibroSure, ELF and repeat ultrasound elastography, and recheck LFTs with autoimmune serologies and assess for viral hepatitis A and B.  Recommended Mediterranean diet and working toward weight loss.  GERD: Has admitted  to some mild dysphagia type symptoms in the past but also has been going through dental work.  Does have daily acid reflux symptoms with some burning and discomfort in the neck region.  Had been doing well with Pepcid initially, however symptoms are more frequent now despite Pepcid.  Will start low-dose PPI.  Screening colon cancer: She has never had a colonoscopy.  Denies any family history of colon cancer or colon polyps that she is aware of.  We will schedule this today.  Plan:  Mediterranean diet Fibrosure, ELF, HFP, ANA, ASMA, AMA, Hep A and B serologies, ferritin RUQ [place] with elastography Start omeprazole 20 mg once daily, 30 minutes prior to breakfast. Proceed with colonoscopy with propofol by Dr. Jena Gauss in near future: the risks, benefits, and alternatives have been discussed with the patient in detail. The patient states understanding and desires to proceed. ASA 2  UPT - possible post menopause but given unsure will check  Follow up 4 months.     Follow Up Instructions:  Follow up in 4 months.   I discussed the assessment and treatment plan with the patient. The patient was provided an opportunity to ask questions and all were answered. The patient agreed with the plan and demonstrated an understanding of the instructions.   The patient was advised to call back or seek an in-person evaluation if the symptoms worsen or if the condition fails to improve as anticipated.    Brooke Bonito, MSN, APRN, FNP-BC, AGACNP-BC [facility]

## 2023-07-25 NOTE — Telephone Encounter (Signed)
 Theresa Lang, you are scheduled for a virtual visit with your provider today.  Just as we do with appointments in the office, we must obtain your consent to participate.  Your consent will be active for this visit and any virtual visit you may have with one of our providers in the next 365 days.  If you have a MyChart account, I can also send a copy of this consent to you electronically.  All virtual visits are billed to your insurance company just like a traditional visit in the office.  As this is a virtual visit, video technology does not allow for your provider to perform a traditional examination.  This may limit your provider's ability to fully assess your condition.  If your provider identifies any concerns that need to be evaluated in person or the need to arrange testing such as labs, EKG, etc, we will make arrangements to do so.  Although advances in technology are sophisticated, we cannot ensure that it will always work on either your end or our end.  If the connection with a video visit is poor, we may have to switch to a telephone visit.  With either a video or telephone visit, we are not always able to ensure that we have a secure connection.   I need to obtain your verbal consent now.   Are you willing to proceed with your visit today? yes

## 2023-07-25 NOTE — Patient Instructions (Signed)
 Please have blood work completed at [facility].  We will call you with results once they have been received. Please allow 3-5 business days for review.  You may also have these completed at [facility] lab. 2 locations for [facility] in [place]:              1. [address], [place]              2. [address] [place], [place]   Start omeprazole 20 mg once daily, 30 minutes prior to breakfast.  This is for your acid reflux.  We will get you scheduled for an ultrasound of your liver.  We will also schedule you for colonoscopy in the near future with Dr. Jena Gauss.  We will provide detailed written instructions to you regarding your prep.  We will follow-up in 4 months.  Below is information regarding a Mediterranean diet, please look over this and try to incorporate this into your daily life.  This is a great diet for liver health.  It was a pleasure to see you today. I want to create trusting relationships with patients. If you receive a survey regarding your visit,  I greatly appreciate you taking time to fill this out on paper or through your MyChart. I value your feedback.  Brooke Bonito, MSN, FNP-BC, AGACNP-BC [facility]  What is the Mediterranean Diet? The Mediterranean Diet is a way of eating that emphasizes plant-based foods and healthy fats. You focus on overall eating patterns rather than following strict formulas or calculations.  In general, you'll eat: Lots of vegetables, fruit, beans, lentils and nuts. A good amount of whole grains, like whole-wheat bread and brown rice. Plenty of extra virgin olive oil (EVOO) as a source of healthy fat. A good amount of fish, especially fish rich in omega-3 fatty acids. A moderate amount of natural cheese and yogurt. Little or no red meat, choosing poultry, fish or beans instead of red meat. Little or no sweets, sugary drinks or butter. A moderate amount of wine with meals (but if you don't already  drink, don't start). This is how people ate in certain Mediterranean countries in the mid-20th century. Researchers have linked these eating patterns with a reduced risk of coronary artery disease (CAD). Today, healthcare providers recommend this eating plan if you have risk factors for heart disease or to support other aspects of your health.  A dietitian can help you modify your approach as needed based on your medical history, underlying conditions, allergies and preferences.    What are the benefits of the Mediterranean Diet? The mediterranean diet allows you to focus on overall eating patterns rather than following strict formulas or calculations.  The Mediterranean Diet has many benefits, including: Lowering your risk of cardiovascular disease, including a heart attack or stroke. Supporting a body weight that's healthy for you. Supporting healthy blood sugar levels, blood pressure and cholesterol. Lowering your risk of metabolic syndrome. Supporting a healthy balance of gut microbiota (bacteria and other microorganisms) in your digestive system. Lowering your risk for certain types of cancer. Slowing the decline of brain function as you age. Helping you live longer.  The Mediterranean Diet has these benefits because it: Limits saturated fat and trans fat. You need some saturated fat, but only in small amounts. Eating too much saturated fat can raise your LDL (bad) cholesterol. A high LDL raises your risk of plaque buildup in your arteries (atherosclerosis). Trans fat has no health benefits. Both  of these "unhealthy fats" can cause inflammation. Encourages healthy unsaturated fats, including omega-3 fatty acids. Unsaturated fats promote healthy cholesterol levels, support brain health and combat inflammation. Plus, a diet high in unsaturated fats and low in saturated fat promotes healthy blood sugar levels. Limits sodium. Eating foods high in sodium can raise your blood pressure, putting  you at a greater risk for a heart attack or stroke. Limits refined carbohydrates, including sugar. Foods high in refined carbs can cause your blood sugar to spike. Refined carbs also give you excess calories without much nutritional benefit. For example, such foods often have little or no fiber. Favors foods high in fiber and antioxidants. These nutrients help reduce inflammation throughout your body. Fiber also helps keep waste moving through your large intestine and helps maintain healthy blood sugar levels. Antioxidants protect you against cancer by warding off free radicals. The Mediterranean Diet includes many different nutrients that work together to help your body. There's no single food or ingredient responsible for the Mediterranean Diet's benefits. Instead, the diet is healthy for you because of the combination of nutrients it provides.  Think of a choir with many people singing. One voice alone might carry part of the tune, but you need all the voices to come together to achieve the full effect. Similarly, the Mediterranean Diet works by giving you an ideal blend of nutrients that harmonize to support your health.  Mediterranean Diet food list The Mediterranean Diet encourages you to eat plenty of some foods (like whole grains and vegetables) while limiting others. If you're planning a grocery store trip, you might wonder which foods to buy. Here are some examples of foods to eat often with the Mediterranean Diet.  A Mediterranean Diet food list includes a mix of veggies, tubers, fruits, grains, nuts, seeds and legumes.    Mediterranean Diet serving goals and sizes A fridge and pantry full of nutritious foods are great for starters. But where do you go from there? How much of each food do you need? It's always best to talk to a dietitian to get advice tailored to your needs as you get started. The chart below offers some general guidance on serving goals and serving sizes, according to the  type of food.    How do I create a Mediterranean Diet meal plan? It's important to consult with a primary care physician (PCP) or dietitian before making drastic changes to your diet or trying any new eating plan. They'll make sure your intended plan is best for you based on your individual needs. They may also share meal plans and recipes for you to try at home.  In general, when thinking about meals, you'll want to collect some go-to options and recipes for breakfasts, lunches, dinners and snacks. The more variety, the better. You don't want to get stuck in a rut or feel like you're restricted in which foods you can or should eat. Luckily, there's plenty of room for changing things up with the Mediterranean Diet.    What foods are not allowed on the Mediterranean Diet? The Mediterranean Diet doesn't set hard and fast rules for what you're allowed or not allowed. Rather, it encourages you to eat more of certain foods and limit others. Here's what you should try to limit as much as possible: Any foods with added sugar, like bakery goods, ice cream and even some granola bars. Any drinks with added sugar, including fruit juices and sodas. Beer and liquor. Foods high in sodium or saturated fat.  Refined carbohydrates, like white bread and white rice. Highly processed foods, like some cheeses. Fatty or processed meats. Additional Common Questions What is the Mediterranean Diet pyramid? The Mediterranean Diet pyramid is one way to visualize what foods you should eat and how often. Different organizations have created slightly different versions of this pyramid. All Mediterranean Diet pyramids encourage you to eat mostly veggies, fruits, whole grains and extra virgin olive oil while limiting red meat and sweets.  How does lifestyle relate to the Mediterranean Diet?  To get the most from your eating plan, try to: Exercise regularly, ideally with others. Avoid smoking or using any tobacco  products. Prepare and enjoy meals with family and friends. Theresa Lang more often than you eat out. Eat locally sourced foods whenever possible.  Can the Mediterranean Diet be vegetarian? Yes. If you prefer a vegetarian diet, you can easily modify the Mediterranean Diet to exclude meat and fish. In that case, you'd gain your protein solely from plant sources like nuts and beans. Talk to a dietitian to learn more.  Can the Mediterranean Diet be gluten-free? Yes. You can modify recipes to exclude gluten-based products. Talk to a dietitian for recipe ideas and support in making necessary changes.  Can I use regular olive oil instead of extra virgin olive oil? Regular olive oil is a good alternative to an oil that's high in saturated fat (like palm oil). However, to get the most benefits, opt for extra virgin olive oil.  A crucial fact to know before starting the Mediterranean Diet is that not all olive oils are the same. The Mediterranean Diet calls for extra virgin olive oil (EVOO), specifically. That's because it has a healthy fat ratio. This means EVOO contains more healthy fat (unsaturated) than unhealthy fat (saturated). Aside from its fat ratio, EVOO is healthy because it's high in antioxidants.  Antioxidants help protect your cells from damage, therefore protecting your heart and brain and reducing inflammation throughout your body. Because it's manufactured differently, regular olive oil doesn't contain as many of these antioxidants.  A note from [facility]: In a world with endless diet options, it can be hard to know which one is right for you. Research has proven the benefits of the Mediterranean Diet for many people, especially those at risk for heart disease. Beyond protecting your heart, the Mediterranean Diet can help you prevent or manage many other conditions.

## 2023-07-26 NOTE — Telephone Encounter (Signed)
 LMOVM to return call.

## 2023-07-26 NOTE — Telephone Encounter (Signed)
 Left detailed message with [place] appt details and number if needs to call and reschedule. Also to call us back to schedule her colonoscopy with Dr.Rourk

## 2023-07-27 ENCOUNTER — Ambulatory Visit (HOSPITAL_COMMUNITY): Attending: Gastroenterology

## 2023-08-02 ENCOUNTER — Encounter: Payer: Self-pay | Admitting: *Deleted

## 2023-08-02 NOTE — Telephone Encounter (Signed)
 Mailed letter

## 2023-08-07 MED ORDER — PEG 3350-KCL-NA BICARB-NACL 420 G PO SOLR: 4000.0000 mL | Freq: Once | ORAL | 0 refills | Status: AC

## 2023-08-07 NOTE — Telephone Encounter (Signed)
 Patient returned call.  She has been scheduled for u/s 3/26, arrival 9:15am, npo midnight  Her TCS with Dr. Jena Gauss ASA 2 is scheduled for 4/21 at 10:45am. Aware will send instructions to her  and rx for prep to [facility].

## 2023-08-07 NOTE — Telephone Encounter (Signed)
 Per [employer] "This member's plan does not currently require notification or prior-authorization through the [employer] Notification or Prior-Authorization Program"

## 2023-08-07 NOTE — Addendum Note (Signed)
 Addended by: Armstead Peaks on: 08/07/2023 03:53 PM   Modules accepted: Orders

## 2023-08-16 ENCOUNTER — Ambulatory Visit (HOSPITAL_COMMUNITY)
Admission: RE | Admit: 2023-08-16 | Discharge: 2023-08-16 | Disposition: A | Discharge status: Home / Self Care | Source: Ambulatory Visit | Attending: Gastroenterology | Admitting: Gastroenterology

## 2023-08-16 DIAGNOSIS — R7989 Other specified abnormal findings of blood chemistry: Secondary | ICD-10-CM | POA: Diagnosis present

## 2023-08-31 ENCOUNTER — Encounter: Payer: Self-pay | Admitting: *Deleted

## 2023-09-08 LAB — NASH FIBROSURE(R) PLUS

## 2023-09-09 LAB — NASH FIBROSURE(R) PLUS
ALPHA 2-MACROGLOBULINS, QN: 205 mg/dL (ref 110–276)
ALT (SGPT) P5P: 116 IU/L — ABNORMAL HIGH (ref 0–40)
AST (SGOT) P5P: 71 IU/L — ABNORMAL HIGH (ref 0–40)
Apolipoprotein A-1: 144 mg/dL (ref 116–209)
Bilirubin, Total: 0.4 mg/dL (ref 0.0–1.2)
Cholesterol, Total: 224 mg/dL — ABNORMAL HIGH (ref 100–199)
Fibrosis Score: 0.17 (ref 0.00–0.21)
GGT: 91 IU/L — ABNORMAL HIGH (ref 0–60)
Glucose: 100 mg/dL — ABNORMAL HIGH (ref 70–99)
Haptoglobin: 215 mg/dL (ref 42–296)
NASH Score: 0.76 — ABNORMAL HIGH (ref 0.00–0.25)
Steatosis Score: 0.78 — ABNORMAL HIGH (ref 0.00–0.40)
Triglycerides: 178 mg/dL — ABNORMAL HIGH (ref 0–149)

## 2023-09-09 LAB — COMPREHENSIVE METABOLIC PANEL WITH GFR
ALT: 103 IU/L — ABNORMAL HIGH (ref 0–32)
AST: 64 IU/L — ABNORMAL HIGH (ref 0–40)
Albumin: 4.5 g/dL (ref 3.9–4.9)
Alkaline Phosphatase: 156 IU/L — ABNORMAL HIGH (ref 44–121)
BUN/Creatinine Ratio: 8 — ABNORMAL LOW (ref 9–23)
BUN: 6 mg/dL (ref 6–24)
Bilirubin Total: 0.4 mg/dL (ref 0.0–1.2)
CO2: 23 mmol/L (ref 20–29)
Calcium: 9.9 mg/dL (ref 8.7–10.2)
Chloride: 100 mmol/L (ref 96–106)
Creatinine, Ser: 0.77 mg/dL (ref 0.57–1.00)
Globulin, Total: 3.4 g/dL (ref 1.5–4.5)
Glucose: 94 mg/dL (ref 70–99)
Potassium: 4.2 mmol/L (ref 3.5–5.2)
Sodium: 139 mmol/L (ref 134–144)
Total Protein: 7.9 g/dL (ref 6.0–8.5)
eGFR: 96 mL/min/{1.73_m2} (ref >59)

## 2023-09-09 LAB — ANA W/REFLEX IF POSITIVE
Anti JO-1: <0.2 AI (ref 0.0–0.9)
Anti Nuclear Antibody (ANA): POSITIVE — AB
Centromere Ab Screen: <0.2 AI (ref 0.0–0.9)
Chromatin Ab SerPl-aCnc: <0.2 AI (ref 0.0–0.9)
ENA RNP Ab: 0.8 AI (ref 0.0–0.9)
ENA SM Ab Ser-aCnc: <0.2 AI (ref 0.0–0.9)
ENA SSA (RO) Ab: >8 AI — ABNORMAL HIGH (ref 0.0–0.9)
ENA SSB (LA) Ab: <0.2 AI (ref 0.0–0.9)
Scleroderma (Scl-70) (ENA) Antibody, IgG: <0.2 AI (ref 0.0–0.9)
dsDNA Ab: 1 [IU]/mL (ref 0–9)

## 2023-09-09 LAB — HEPATITIS B SURFACE ANTIBODY,QUALITATIVE: Hep B Surface Ab, Qual: NONREACTIVE

## 2023-09-09 LAB — HEPATITIS B CORE ANTIBODY, TOTAL: Hep B Core Total Ab: NEGATIVE

## 2023-09-09 LAB — FERRITIN: Ferritin: 133 ng/mL (ref 15–150)

## 2023-09-09 LAB — HEPATITIS A ANTIBODY, TOTAL: hep A Total Ab: POSITIVE — AB

## 2023-09-09 LAB — HEPATITIS B SURFACE ANTIGEN: Hepatitis B Surface Ag: NEGATIVE

## 2023-09-09 LAB — HEPATITIS A ANTIBODY, IGM: Hep A IgM: NEGATIVE

## 2023-09-09 LAB — ANTI-SMOOTH MUSCLE ANTIBODY, IGG: Smooth Muscle Ab: 9 U (ref 0–19)

## 2023-09-09 LAB — MITOCHONDRIAL ANTIBODIES: Mitochondrial Ab: <20 U (ref 0.0–20.0)

## 2023-09-09 LAB — ENHANCED LIVER FIBROSIS (ELF): ELF(TM) Score: 10.13 — ABNORMAL HIGH (ref <9.80)

## 2023-09-11 ENCOUNTER — Encounter (HOSPITAL_COMMUNITY)
Admission: RE | Disposition: A | Discharge status: Home / Self Care | Payer: Self-pay | Source: Home / Self Care | Attending: Internal Medicine

## 2023-09-11 ENCOUNTER — Other Ambulatory Visit: Payer: Self-pay

## 2023-09-11 ENCOUNTER — Ambulatory Visit (HOSPITAL_COMMUNITY)
Admission: RE | Admit: 2023-09-11 | Discharge: 2023-09-11 | Disposition: A | Discharge status: Home / Self Care | Attending: Internal Medicine | Admitting: Internal Medicine

## 2023-09-11 ENCOUNTER — Ambulatory Visit (HOSPITAL_COMMUNITY): Admitting: Anesthesiology

## 2023-09-11 ENCOUNTER — Encounter (HOSPITAL_COMMUNITY): Payer: Self-pay | Admitting: Internal Medicine

## 2023-09-11 DIAGNOSIS — I10 Essential (primary) hypertension: Secondary | ICD-10-CM | POA: Diagnosis not present

## 2023-09-11 DIAGNOSIS — F17210 Nicotine dependence, cigarettes, uncomplicated: Secondary | ICD-10-CM | POA: Diagnosis not present

## 2023-09-11 DIAGNOSIS — Z1211 Encounter for screening for malignant neoplasm of colon: Secondary | ICD-10-CM

## 2023-09-11 HISTORY — PX: COLONOSCOPY: SHX5424

## 2023-09-11 LAB — POCT PREGNANCY, URINE: Preg Test, Ur: NEGATIVE

## 2023-09-11 SURGERY — COLONOSCOPY
Anesthesia: General

## 2023-09-11 MED ORDER — LACTATED RINGERS IV SOLN
INTRAVENOUS | Status: DC | PRN
Start: 2023-09-11 — End: 2023-09-11

## 2023-09-11 MED ORDER — LIDOCAINE HCL (PF) 2 % IJ SOLN
INTRAMUSCULAR | Status: DC | PRN
Start: 2023-09-11 — End: 2023-09-11
  Administered 2023-09-11: 60 mg via INTRADERMAL

## 2023-09-11 MED ORDER — PROPOFOL 500 MG/50ML IV EMUL
INTRAVENOUS | Status: DC | PRN
  Administered 2023-09-11: 150 ug/kg/min via INTRAVENOUS

## 2023-09-11 MED ORDER — PROPOFOL 500 MG/50ML IV EMUL
INTRAVENOUS | Status: AC
  Filled 2023-09-11: qty 50

## 2023-09-11 MED ORDER — PROPOFOL 10 MG/ML IV BOLUS
INTRAVENOUS | Status: DC | PRN
Start: 2023-09-11 — End: 2023-09-11
  Administered 2023-09-11: 70 mg via INTRAVENOUS

## 2023-09-11 MED ORDER — LIDOCAINE HCL (PF) 2 % IJ SOLN
INTRAMUSCULAR | Status: AC
  Filled 2023-09-11: qty 5

## 2023-09-11 NOTE — H&P (Signed)
 @LOGO @   Primary Care Physician:  Lauran Pollard, MD Primary Gastroenterologist:  Dr. Riley Cheadle  Pre-Procedure History & Physical: HPI:  Theresa Lang is a 48 y.o. female is here for a screening colonoscopy.  No bowel symptoms.  No family history colon cancer.  Negative prior colonoscopy.  Past Medical History:  Diagnosis Date   Arthritis     Past Surgical History:  Procedure Laterality Date   CESAREAN SECTION  01/2003    Prior to Admission medications   Medication Sig Start Date End Date Taking? Authorizing Provider  famotidine (PEPCID) 40 MG tablet Take 40 mg by mouth daily.   Yes [provider]  amphetamine-dextroamphetamine (ADDERALL) 20 MG tablet Take 10 mg by mouth 3 (three) times daily.    [provider]  HUMIRA, 2 PEN, 40 MG/0.4ML PNKT SMARTSIG:40 Milligram(s) SUB-Q Every 2 Weeks 06/27/22   [provider]  omeprazole  (PRILOSEC) 20 MG capsule Take 1 capsule (20 mg total) by mouth daily. 07/25/23   Eustacio Highman, NP  Vitamin D, Ergocalciferol, (DRISDOL) 1.25 MG (50000 UNIT) CAPS capsule Take 50,000 Units by mouth once a week. 06/03/22   [provider]    Allergies as of 08/07/2023   (No Known Allergies)    Family History  Problem Relation Age of Onset   Diabetes Maternal Aunt    Liver disease Neg Hx    Colon cancer Neg Hx    Colon polyps Neg Hx     Social History   Socioeconomic History   Marital status: Married    Spouse name: Not on file   Number of children: Not on file   Years of education: Not on file   Highest education level: Not on file  Occupational History   Not on file  Tobacco Use   Smoking status: Every Day    Current packs/day: 0.50    Types: Cigarettes   Smokeless tobacco: Never  Vaping Use   Vaping status: Former  Substance and Sexual Activity   Alcohol use: Yes    Comment: occ   Drug use: Not Currently   Sexual activity: Yes  Other Topics Concern   Not on file  Social History Narrative    Not on file   Social Drivers of Health   Financial Resource Strain: Not on file  Food Insecurity: Not on file  Transportation Needs: Not on file  Physical Activity: Not on file  Stress: Not on file  Social Connections: Not on file  Intimate Partner Violence: Not on file    Review of Systems: See HPI, otherwise negative ROS  Physical Exam: BP 114/86   Pulse 87   Temp 98.3 F (36.8 C) (Oral)   Resp 19   Ht 5' (1.524 m)   Wt 66.2 kg   SpO2 96%   BMI 28.51 kg/m  General:   Alert,  Well-developed, well-nourished, pleasant and cooperative in NAD Lungs:  Clear throughout to auscultation.   No wheezes, crackles, or rhonchi. No acute distress. Heart:  Regular rate and rhythm; no murmurs, clicks, rubs,  or gallops. Abdomen:  Soft, nontender and nondistended. No masses, hepatosplenomegaly or hernias noted. Normal bowel sounds, without guarding, and without rebound.    Impression/Plan: Theresa Lang is now here to undergo a screening colonoscopy.  First ever average risk screening examination.  Risks, benefits, limitations, imponderables and alternatives regarding colonoscopy have been reviewed with the patient. Questions have been answered. All parties agreeable.     Notice:  This dictation was prepared with  Dragon dictation along with smaller Lobbyist. Any transcriptional errors that result from this process are unintentional and may not be corrected upon review.

## 2023-09-11 NOTE — Discharge Instructions (Addendum)
  Colonoscopy Discharge Instructions  Read the instructions outlined below and refer to this sheet in the next few weeks. These discharge instructions provide you with general information on caring for yourself after you leave the hospital. Your doctor may also give you specific instructions. While your treatment has been planned according to the most current medical practices available, unavoidable complications occasionally occur. If you have any problems or questions after discharge, call Dr. Riley Cheadle at [phone]. ACTIVITY You may resume your regular activity, but move at a slower pace for the next 24 hours.  Take frequent rest periods for the next 24 hours.  Walking will help get rid of the air and reduce the bloated feeling in your belly (abdomen).  No driving for 24 hours (because of the medicine (anesthesia) used during the test).   Do not sign any important legal documents or operate any machinery for 24 hours (because of the anesthesia used during the test).  NUTRITION Drink plenty of fluids.  You may resume your normal diet as instructed by your doctor.  Begin with a light meal and progress to your normal diet. Heavy or fried foods are harder to digest and may make you feel sick to your stomach (nauseated).  Avoid alcoholic beverages for 24 hours or as instructed.  MEDICATIONS You may resume your normal medications unless your doctor tells you otherwise.  WHAT YOU CAN EXPECT TODAY Some feelings of bloating in the abdomen.  Passage of more gas than usual.  Spotting of blood in your stool or on the toilet paper.  IF YOU HAD POLYPS REMOVED DURING THE COLONOSCOPY: No aspirin products for 7 days or as instructed.  No alcohol for 7 days or as instructed.  Eat a soft diet for the next 24 hours.  FINDING OUT THE RESULTS OF YOUR TEST Not all test results are available during your visit. If your test results are not back during the visit, make an appointment with your caregiver to find out the  results. Do not assume everything is normal if you have not heard from your caregiver or the medical facility. It is important for you to follow up on all of your test results.  SEEK IMMEDIATE MEDICAL ATTENTION IF: You have more than a spotting of blood in your stool.  Your belly is swollen (abdominal distention).  You are nauseated or vomiting.  You have a temperature over 101.  You have abdominal pain or discomfort that is severe or gets worse throughout the day.      Your colonoscopy was normal today  Recommend repeat screening examination in 10 years.

## 2023-09-11 NOTE — Transfer of Care (Signed)
 Immediate Anesthesia Transfer of Care Note  Patient: Theresa Lang  Procedure(s) Performed: COLONOSCOPY  Patient Location: Endoscopy Unit  Anesthesia Type:General  Level of Consciousness: drowsy and patient cooperative  Airway & Oxygen Therapy: Patient Spontanous Breathing  Post-op Assessment: Report given to RN and Post -op Vital signs reviewed and stable  Post vital signs: Reviewed and stable  Last Vitals:  Vitals Value Taken Time  BP 101/70 09/11/23 1127  Temp 36.4 C 09/11/23 1127  Pulse 85 09/11/23 1127  Resp 25 09/11/23 1127  SpO2 97 % 09/11/23 1127    Last Pain:  Vitals:   09/11/23 1127  TempSrc: Axillary  PainSc: 0-No pain      Patients Stated Pain Goal: 8 (09/11/23 0910)  Complications: No notable events documented.

## 2023-09-11 NOTE — Anesthesia Preprocedure Evaluation (Signed)
Anesthesia Evaluation  Patient identified by MRN, date of birth, ID band Patient awake    Reviewed: Allergy & Precautions, H&P , NPO status , Patient's Chart, lab work & pertinent test results, reviewed documented beta blocker date and time   Airway Mallampati: II  TM Distance: >3 FB Neck ROM: full    Dental no notable dental hx.    Pulmonary neg pulmonary ROS, Current Smoker and Patient abstained from smoking.   Pulmonary exam normal breath sounds clear to auscultation       Cardiovascular Exercise Tolerance: Good hypertension, negative cardio ROS  Rhythm:regular Rate:Normal     Neuro/Psych negative neurological ROS  negative psych ROS   GI/Hepatic negative GI ROS, Neg liver ROS,,,  Endo/Other  negative endocrine ROS    Renal/GU negative Renal ROS  negative genitourinary   Musculoskeletal   Abdominal   Peds  Hematology negative hematology ROS (+)   Anesthesia Other Findings   Reproductive/Obstetrics negative OB ROS                             Anesthesia Physical Anesthesia Plan  ASA: 2  Anesthesia Plan: General   Post-op Pain Management:    Induction:   PONV Risk Score and Plan: Propofol infusion  Airway Management Planned:   Additional Equipment:   Intra-op Plan:   Post-operative Plan:   Informed Consent: I have reviewed the patients History and Physical, chart, labs and discussed the procedure including the risks, benefits and alternatives for the proposed anesthesia with the patient or authorized representative who has indicated his/her understanding and acceptance.     Dental Advisory Given  Plan Discussed with: CRNA  Anesthesia Plan Comments:        Anesthesia Quick Evaluation

## 2023-09-11 NOTE — Op Note (Signed)
 [facility] Patient Name: Theresa Lang Procedure Date: 09/11/2023 10:52 AM MRN: [id number] Date of Birth: 1976/03/01 Attending MD: Gemma Kelp , MD, [id number] CSN: [id number] Age: 48 Admit Type: Outpatient Procedure:                Colonoscopy Indications:              Screening for colorectal malignant neoplasm Providers:                Gemma Kelp, MD, Troy Furnish. Museum/gallery exhibitions officer, Charity fundraiser,                            Wilfredo Hanly. Roberta Chin, Technician Referring MD:              Medicines:                Propofol  per Anesthesia Complications:            No immediate complications. Estimated Blood Loss:     Estimated blood loss: none. Procedure:                Pre-Anesthesia Assessment:                           - Prior to the procedure, a History and Physical                            was performed, and patient medications and                            allergies were reviewed. The patient's tolerance of                            previous anesthesia was also reviewed. The risks                            and benefits of the procedure and the sedation                            options and risks were discussed with the patient.                            All questions were answered, and informed consent                            was obtained. Prior Anticoagulants: The patient has                            taken no anticoagulant or antiplatelet agents. ASA                            Grade Assessment: II - A patient with mild systemic                            disease. After reviewing the risks and benefits,  the patient was deemed in satisfactory condition to                            undergo the procedure.                           After obtaining informed consent, the colonoscope                            was passed under direct vision. Throughout the                            procedure, the patient's blood pressure, pulse, and                             oxygen saturations were monitored continuously. The                            [id number]) scope was introduced through the                            anus and advanced to the the cecum, identified by                            appendiceal orifice and ileocecal valve. The                            colonoscopy was performed without difficulty. The                            patient tolerated the procedure well. The quality                            of the bowel preparation was adequate. The                            ileocecal valve, appendiceal orifice, and rectum                            were photographed. Scope In: 11:11:15 AM Scope Out: 11:22:25 AM Scope Withdrawal Time: 0 hours 6 minutes 11 seconds  Total Procedure Duration: 0 hours 11 minutes 10 seconds  Findings:      The colon (entire examined portion) appeared normal.      The retroflexed view of the distal rectum and anal verge was normal and       showed no anal or rectal abnormalities. Impression:               - The entire examined colon is normal.                           - The distal rectum and anal verge are normal on                            retroflexion view.                           -  No specimens collected. Moderate Sedation:      Moderate (conscious) sedation was personally administered by an       anesthesia professional. The following parameters were monitored: oxygen       saturation, heart rate, blood pressure, respiratory rate, EKG, adequacy       of pulmonary ventilation, and response to care. Recommendation:           - Patient has a contact number available for                            emergencies. The signs and symptoms of potential                            delayed complications were discussed with the                            patient. Return to normal activities tomorrow.                            Written discharge instructions were provided to the                             patient.                           - Advance diet as tolerated.                           - Continue present medications.                           - Repeat colonoscopy in 10 years for screening                            purposes.                           - Return to GI office PRN. Procedure Code(s):        --- Professional ---                           [phone], Colonoscopy, flexible; diagnostic, including                            collection of specimen(s) by brushing or washing,                            when performed (separate procedure) Diagnosis Code(s):        --- Professional ---                           Z12.11, Encounter for screening for malignant                            neoplasm of colon CPT copyright 2022 American [employer]. All rights reserved. The codes documented in this report are preliminary and upon coder review  may  be revised to meet current compliance requirements. Windsor Hatcher. Bladen Umar, MD Gemma Kelp, MD 09/11/2023 11:31:24 AM This report has been signed electronically. Number of Addenda: 0

## 2023-09-12 ENCOUNTER — Encounter (HOSPITAL_COMMUNITY): Payer: Self-pay | Admitting: Internal Medicine

## 2023-09-12 NOTE — Anesthesia Postprocedure Evaluation (Signed)
 Anesthesia Post Note  Patient: Theresa Lang  Procedure(s) Performed: COLONOSCOPY  Patient location during evaluation: Phase II Anesthesia Type: General Level of consciousness: awake Pain management: pain level controlled Vital Signs Assessment: post-procedure vital signs reviewed and stable Respiratory status: spontaneous breathing and respiratory function stable Cardiovascular status: blood pressure returned to baseline and stable Postop Assessment: no headache and no apparent nausea or vomiting Anesthetic complications: no Comments: Late entry   No notable events documented.   Last Vitals:  Vitals:   09/11/23 0910 09/11/23 1127  BP: 114/86 101/70  Pulse: 87 85  Resp: 19 (!) 25  Temp: 36.8 C (!) 36.4 C  SpO2: 96% 97%    Last Pain:  Vitals:   09/11/23 1127  TempSrc: Axillary  PainSc: 0-No pain                 Coretha Dew

## 2023-09-22 ENCOUNTER — Encounter: Payer: Self-pay | Admitting: *Deleted
